# Patient Record
Sex: Female | Born: 1945 | Race: White | Hispanic: No | Smoking: Former smoker
Health system: Southern US, Community
[De-identification: ages and names within clinical notes are randomized; demographics above are authoritative.]

## PROBLEM LIST (undated history)

## (undated) DIAGNOSIS — I10 Essential (primary) hypertension: Secondary | ICD-10-CM

## (undated) DIAGNOSIS — E785 Hyperlipidemia, unspecified: Secondary | ICD-10-CM

## (undated) DIAGNOSIS — R569 Unspecified convulsions: Secondary | ICD-10-CM

## (undated) DIAGNOSIS — E119 Type 2 diabetes mellitus without complications: Secondary | ICD-10-CM

## (undated) DIAGNOSIS — I639 Cerebral infarction, unspecified: Secondary | ICD-10-CM

## (undated) DIAGNOSIS — T7840XA Allergy, unspecified, initial encounter: Secondary | ICD-10-CM

## (undated) DIAGNOSIS — F329 Major depressive disorder, single episode, unspecified: Secondary | ICD-10-CM

## (undated) DIAGNOSIS — G8929 Other chronic pain: Secondary | ICD-10-CM

## (undated) DIAGNOSIS — F32A Depression, unspecified: Secondary | ICD-10-CM

## (undated) HISTORY — DX: Allergy, unspecified, initial encounter: T78.40XA

## (undated) HISTORY — DX: Cerebral infarction, unspecified: I63.9

## (undated) HISTORY — DX: Depression, unspecified: F32.A

## (undated) HISTORY — DX: Type 2 diabetes mellitus without complications: E11.9

## (undated) HISTORY — DX: Unspecified convulsions: R56.9

## (undated) HISTORY — DX: Essential (primary) hypertension: I10

## (undated) HISTORY — DX: Other chronic pain: G89.29

## (undated) HISTORY — PX: FOOT SURGERY: SHX648

## (undated) HISTORY — PX: BACK SURGERY: SHX140

## (undated) HISTORY — PX: ABDOMINAL HYSTERECTOMY: SHX81

## (undated) HISTORY — DX: Major depressive disorder, single episode, unspecified: F32.9

## (undated) HISTORY — DX: Hyperlipidemia, unspecified: E78.5

---

## 2000-12-06 ENCOUNTER — Ambulatory Visit (HOSPITAL_COMMUNITY): Admission: RE | Admit: 2000-12-06 | Discharge: 2000-12-06 | Payer: Self-pay | Admitting: Neurosurgery

## 2000-12-06 ENCOUNTER — Encounter: Payer: Self-pay | Admitting: Neurosurgery

## 2001-02-12 ENCOUNTER — Ambulatory Visit (HOSPITAL_COMMUNITY)
Admission: RE | Admit: 2001-02-12 | Discharge: 2001-02-12 | Payer: Self-pay | Admitting: Physical Medicine & Rehabilitation

## 2001-02-12 ENCOUNTER — Encounter: Payer: Self-pay | Admitting: Physical Medicine & Rehabilitation

## 2001-02-28 ENCOUNTER — Encounter: Payer: Self-pay | Admitting: Physical Medicine & Rehabilitation

## 2001-02-28 ENCOUNTER — Ambulatory Visit (HOSPITAL_COMMUNITY)
Admission: RE | Admit: 2001-02-28 | Discharge: 2001-02-28 | Payer: Self-pay | Admitting: Physical Medicine & Rehabilitation

## 2001-06-27 ENCOUNTER — Encounter: Payer: Self-pay | Admitting: Physical Medicine & Rehabilitation

## 2001-06-27 ENCOUNTER — Encounter
Admission: RE | Admit: 2001-06-27 | Discharge: 2001-06-27 | Payer: Self-pay | Admitting: Physical Medicine & Rehabilitation

## 2001-07-12 ENCOUNTER — Encounter: Payer: Self-pay | Admitting: Physical Medicine & Rehabilitation

## 2001-07-12 ENCOUNTER — Encounter
Admission: RE | Admit: 2001-07-12 | Discharge: 2001-07-12 | Payer: Self-pay | Admitting: Physical Medicine & Rehabilitation

## 2001-07-30 ENCOUNTER — Encounter
Admission: RE | Admit: 2001-07-30 | Discharge: 2001-07-30 | Payer: Self-pay | Admitting: Physical Medicine & Rehabilitation

## 2001-07-30 ENCOUNTER — Encounter: Payer: Self-pay | Admitting: Physical Medicine & Rehabilitation

## 2001-10-02 ENCOUNTER — Encounter
Admission: RE | Admit: 2001-10-02 | Discharge: 2001-12-31 | Payer: Self-pay | Admitting: Physical Medicine & Rehabilitation

## 2002-02-25 DIAGNOSIS — I2510 Atherosclerotic heart disease of native coronary artery without angina pectoris: Secondary | ICD-10-CM | POA: Insufficient documentation

## 2002-07-31 ENCOUNTER — Encounter: Admission: RE | Admit: 2002-07-31 | Discharge: 2002-10-29 | Payer: Self-pay | Admitting: Neurosurgery

## 2003-01-27 ENCOUNTER — Encounter
Admission: RE | Admit: 2003-01-27 | Discharge: 2003-04-27 | Payer: Self-pay | Admitting: Physical Medicine & Rehabilitation

## 2003-04-29 ENCOUNTER — Encounter
Admission: RE | Admit: 2003-04-29 | Discharge: 2003-07-28 | Payer: Self-pay | Admitting: Physical Medicine & Rehabilitation

## 2003-09-26 ENCOUNTER — Encounter
Admission: RE | Admit: 2003-09-26 | Discharge: 2003-12-25 | Payer: Self-pay | Admitting: Physical Medicine & Rehabilitation

## 2004-01-27 ENCOUNTER — Encounter
Admission: RE | Admit: 2004-01-27 | Discharge: 2004-04-26 | Payer: Self-pay | Admitting: Physical Medicine & Rehabilitation

## 2004-01-29 ENCOUNTER — Ambulatory Visit: Payer: Self-pay | Admitting: Physical Medicine & Rehabilitation

## 2004-05-19 ENCOUNTER — Encounter
Admission: RE | Admit: 2004-05-19 | Discharge: 2004-08-17 | Payer: Self-pay | Admitting: Physical Medicine & Rehabilitation

## 2004-05-20 ENCOUNTER — Ambulatory Visit: Payer: Self-pay | Admitting: Physical Medicine & Rehabilitation

## 2004-08-16 ENCOUNTER — Ambulatory Visit: Payer: Self-pay | Admitting: Physical Medicine & Rehabilitation

## 2004-11-17 ENCOUNTER — Encounter
Admission: RE | Admit: 2004-11-17 | Discharge: 2005-02-15 | Payer: Self-pay | Admitting: Physical Medicine & Rehabilitation

## 2004-11-17 ENCOUNTER — Ambulatory Visit: Payer: Self-pay | Admitting: Physical Medicine & Rehabilitation

## 2005-02-15 ENCOUNTER — Encounter
Admission: RE | Admit: 2005-02-15 | Discharge: 2005-05-16 | Payer: Self-pay | Admitting: Physical Medicine & Rehabilitation

## 2005-02-15 ENCOUNTER — Ambulatory Visit: Payer: Self-pay | Admitting: Physical Medicine & Rehabilitation

## 2005-05-18 ENCOUNTER — Encounter
Admission: RE | Admit: 2005-05-18 | Discharge: 2005-08-16 | Payer: Self-pay | Admitting: Physical Medicine & Rehabilitation

## 2005-05-18 ENCOUNTER — Ambulatory Visit: Payer: Self-pay | Admitting: Physical Medicine & Rehabilitation

## 2005-06-28 ENCOUNTER — Ambulatory Visit: Payer: Self-pay | Admitting: Internal Medicine

## 2005-08-17 ENCOUNTER — Ambulatory Visit: Payer: Self-pay | Admitting: Physical Medicine & Rehabilitation

## 2005-08-17 ENCOUNTER — Encounter
Admission: RE | Admit: 2005-08-17 | Discharge: 2005-11-15 | Payer: Self-pay | Admitting: Physical Medicine & Rehabilitation

## 2005-11-08 ENCOUNTER — Ambulatory Visit: Payer: Self-pay | Admitting: Physical Medicine & Rehabilitation

## 2006-03-08 ENCOUNTER — Ambulatory Visit: Payer: Self-pay | Admitting: Physical Medicine & Rehabilitation

## 2006-03-08 ENCOUNTER — Encounter
Admission: RE | Admit: 2006-03-08 | Discharge: 2006-06-06 | Payer: Self-pay | Admitting: Physical Medicine & Rehabilitation

## 2006-07-03 ENCOUNTER — Encounter
Admission: RE | Admit: 2006-07-03 | Discharge: 2006-10-01 | Payer: Self-pay | Admitting: Physical Medicine & Rehabilitation

## 2006-07-03 ENCOUNTER — Ambulatory Visit: Payer: Self-pay | Admitting: Physical Medicine & Rehabilitation

## 2006-10-31 ENCOUNTER — Encounter
Admission: RE | Admit: 2006-10-31 | Discharge: 2006-12-26 | Payer: Self-pay | Admitting: Physical Medicine & Rehabilitation

## 2006-10-31 ENCOUNTER — Ambulatory Visit: Payer: Self-pay | Admitting: Physical Medicine & Rehabilitation

## 2007-02-21 ENCOUNTER — Ambulatory Visit: Payer: Self-pay | Admitting: Physical Medicine & Rehabilitation

## 2007-02-21 ENCOUNTER — Encounter
Admission: RE | Admit: 2007-02-21 | Discharge: 2007-02-26 | Payer: Self-pay | Admitting: Physical Medicine & Rehabilitation

## 2007-06-14 ENCOUNTER — Encounter
Admission: RE | Admit: 2007-06-14 | Discharge: 2007-06-18 | Payer: Self-pay | Admitting: Physical Medicine & Rehabilitation

## 2007-06-14 ENCOUNTER — Ambulatory Visit: Payer: Self-pay | Admitting: Physical Medicine & Rehabilitation

## 2007-10-10 ENCOUNTER — Encounter
Admission: RE | Admit: 2007-10-10 | Discharge: 2007-11-22 | Payer: Self-pay | Admitting: Physical Medicine & Rehabilitation

## 2007-11-22 ENCOUNTER — Ambulatory Visit: Payer: Self-pay | Admitting: Physical Medicine & Rehabilitation

## 2008-02-08 ENCOUNTER — Encounter
Admission: RE | Admit: 2008-02-08 | Discharge: 2008-02-11 | Payer: Self-pay | Admitting: Physical Medicine & Rehabilitation

## 2008-02-11 ENCOUNTER — Ambulatory Visit: Payer: Self-pay | Admitting: Physical Medicine & Rehabilitation

## 2008-06-04 ENCOUNTER — Encounter
Admission: RE | Admit: 2008-06-04 | Discharge: 2008-07-10 | Payer: Self-pay | Admitting: Physical Medicine & Rehabilitation

## 2008-06-05 ENCOUNTER — Ambulatory Visit: Payer: Self-pay | Admitting: Physical Medicine & Rehabilitation

## 2008-07-10 ENCOUNTER — Ambulatory Visit: Payer: Self-pay | Admitting: Physical Medicine & Rehabilitation

## 2008-09-30 ENCOUNTER — Encounter
Admission: RE | Admit: 2008-09-30 | Discharge: 2008-10-03 | Payer: Self-pay | Admitting: Physical Medicine & Rehabilitation

## 2008-10-03 ENCOUNTER — Ambulatory Visit: Payer: Self-pay | Admitting: Physical Medicine & Rehabilitation

## 2008-12-23 ENCOUNTER — Encounter
Admission: RE | Admit: 2008-12-23 | Discharge: 2008-12-26 | Payer: Self-pay | Admitting: Physical Medicine & Rehabilitation

## 2008-12-26 ENCOUNTER — Ambulatory Visit: Payer: Self-pay | Admitting: Physical Medicine & Rehabilitation

## 2009-01-09 ENCOUNTER — Inpatient Hospital Stay: Payer: Self-pay | Admitting: Vascular Surgery

## 2009-03-11 ENCOUNTER — Ambulatory Visit: Payer: Self-pay | Admitting: Cardiology

## 2009-03-11 ENCOUNTER — Ambulatory Visit: Payer: Self-pay | Admitting: Ophthalmology

## 2009-03-25 ENCOUNTER — Ambulatory Visit: Payer: Self-pay | Admitting: Ophthalmology

## 2009-05-12 ENCOUNTER — Ambulatory Visit: Payer: Self-pay | Admitting: Ophthalmology

## 2009-05-18 ENCOUNTER — Inpatient Hospital Stay: Payer: Self-pay | Admitting: Vascular Surgery

## 2009-06-15 ENCOUNTER — Ambulatory Visit: Payer: Self-pay | Admitting: Vascular Surgery

## 2009-06-25 ENCOUNTER — Ambulatory Visit: Payer: Self-pay | Admitting: Vascular Surgery

## 2009-06-29 ENCOUNTER — Inpatient Hospital Stay: Payer: Self-pay | Admitting: Vascular Surgery

## 2009-07-16 ENCOUNTER — Encounter: Payer: Self-pay | Admitting: Internal Medicine

## 2009-07-16 ENCOUNTER — Ambulatory Visit: Payer: Self-pay | Admitting: Internal Medicine

## 2009-07-22 ENCOUNTER — Ambulatory Visit: Payer: Self-pay | Admitting: Specialist

## 2009-07-26 ENCOUNTER — Encounter: Payer: Self-pay | Admitting: Internal Medicine

## 2009-08-26 ENCOUNTER — Encounter: Payer: Self-pay | Admitting: Internal Medicine

## 2009-09-25 ENCOUNTER — Encounter: Payer: Self-pay | Admitting: Internal Medicine

## 2009-10-26 ENCOUNTER — Encounter: Payer: Self-pay | Admitting: Internal Medicine

## 2009-11-11 ENCOUNTER — Ambulatory Visit: Payer: Self-pay | Admitting: Vascular Surgery

## 2009-11-26 ENCOUNTER — Encounter: Payer: Self-pay | Admitting: Internal Medicine

## 2009-12-07 ENCOUNTER — Inpatient Hospital Stay: Payer: Self-pay | Admitting: Vascular Surgery

## 2010-08-10 NOTE — Assessment & Plan Note (Signed)
Ms. Hirsch returns to the clinic today for followup evaluation.  Overall  she reports she is doing well.  She is using her hydrocodone anywhere  from 0 to 2 tablets per day and just ran out of 100 tablets yesterday,  dated from November 02, 2006.  That indicates that she is certainly not  over using her medication, she has used 100 in approximately 90 days.  She does need a refill in the office today.  She reports that her blood  sugar has been in the 200 range, still on regular insulin 100 units  twice a day.  She and her family physician are reportedly comfortable  with those readings.   REVIEW OF SYSTEMS:  Positive for diarrhea.   MEDICATIONS:  1. Aspirin 325 mg.  2. Regular insulin 100 units q.12 hours.  3. Protonix 40 mg.  4. Tricor 145 mg.  5. Lipitor 80 mg.  6. Lasix 40 mg b.i.d.  7. Hydrocodone 5/500 one tablet b.i.d. p.r.n.   PHYSICAL EXAMINATION:  GENERAL:  A well-appearing, thin adult female in  mild to no acute discomfort.  VITAL SIGNS:  Blood pressure 180/92 with pulse 78, respiratory rate 18,  and O2 saturation 98% on room air.  NEUROLOGICAL:  She has 5-/5 strength throughout the bilateral upper and  lower extremities.  She ambulates without any assistive device.   IMPRESSION:  1. 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  2. Left ankle stress fracture.  3. Intermixed right elbow extensor pain.   In the office today we did refill the patient's hydrocodone, a total of  100 as of February 26, 2007.  We will plan on seeing her in followup in  approximately 4 months time with refills prior to that appointment as  necessary.           ______________________________  Ellwood Dense, M.D.     DC/MedQ  D:  02/26/2007 10:16:55  T:  02/26/2007 10:55:28  Job #:  [id number]

## 2010-08-10 NOTE — Assessment & Plan Note (Signed)
Ms. Reder returns to clinic today for followup evaluation.  She reports  that overall she is doing well.  She has increased her hydrocodone  slightly up to 4 times a day as needed.  She still uses minimal amounts  of that, but does need a refill in the office today.  Her last script  was filled in May 2009.  She reports that her blood sugars still were in  the 200 range.   She reports that she continues to get good benefit from her hydrocodone,  used 4 times a day as needed.   MEDICATIONS:  1. Aspirin 325 mg daily.  2. Regular insulin 100 units q.12 h.  3. Protonix 40 mg daily.  4. TriCor 145 mg daily.  5. Lipitor 80 mg daily.  6. Lasix 40 mg b.i.d.  7. Hydrocodone 5/500 one tablet q.i.d. p.r.n.   PHYSICAL EXAMINATION:  GENERAL:  A well-appearing, fit adult female in  mild acute discomfort.  VITAL SIGNS:  Blood pressure 178/72 with a pulse 78, respiratory rate  18, and O2 saturation 98% on room air.  MUSCULOSKELETAL:  She ambulates without any assistive device.  She has 5-  /5 strength throughout.   IMPRESSION:  1. 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  2. Right ankle stress fracture.  3. Right elbow extensor pain, resolved.   In the office today, we did refill the patient's hydrocodone 5/500 to be  used q.i.d. p.r.n.  We will plan on seeing the patient in followup in  this office in approximately 4 months' time with refills prior to that  appointment as necessary.  The patient continues to get good analgesic  effect overall without signs of diversion or significant side effects.           ______________________________  Ellwood Dense, M.D.     DC/MedQ  D:  11/23/2007 11:43:24  T:  11/24/2007 04:54:09  Job #:  [id number]

## 2010-08-10 NOTE — Assessment & Plan Note (Signed)
Kathleen Mcguire returns today.  She was last seen on February 11, 2008.  She  has a history chronic low back pain secondary to mild lumbar  spondylosis.  She has had a right ankle stress fracture.  She has been  taking hydrocodone 5/500, she gets prescribed 120, but this really last  her about 37-month.  She states she basically takes one a day.  She has  had no new medical problems in interval time.   MEDICATIONS:  Aspirin, regular insulin, Protonix, TriCor, Lipitor, Lasix  as well as the hydrocodone as mentioned.   In the general, an elderly female in no acute distress.  Orientation x3.  Mood and affect are appropriate.  Gait is normal.  She is able to toe  walk, heel walk, but only very shortly, uses a cane because she states  her balance is off.   Back has only mild tenderness to palpation, lumbar paraspinals, lumbar  range of motion by 50% range forward flexion and extension.  Her lower  extremity strength is normal.  Range of motion is mildly reduced in the  hips, otherwise intact at the knees and ankles.  Sensation is intact.  Deep tendon reflexes are normal.   IMPRESSION:  Chronic low back pain.  Lumbar spondylosis.  Given that she  is on such minimal doses of hydrocodone.  We will see if she can be just  as well on tramadol 50 mg b.i.d.  I will check a urine drug screen in  the event that we go back to utilizing narcotic analgesics, I do not  expect any hydrocodone given her usage pattern, mainly checking to see  if there is any non-prescribed opiates or illicit drug.      Erick Colace, M.D.  Electronically Signed     AEK/MedQ  D:  06/05/2008 16:19:26  T:  06/06/2008 03:04:54  Job #:  [id number]

## 2010-08-10 NOTE — Assessment & Plan Note (Signed)
Ms. Penland returns to clinic today for followup evaluation.  She reports  that she is doing fairly well using hydrocodone anywhere from 2-4  tablets per day.  She does need a refill today with her last script  being from late August 2009.   The patient reports that her blood sugars have been in the 200 range and  that her family physician is comfortable with those readings at present.   REVIEW OF SYSTEMS:  Positive for high blood sugar, diarrhea, and night  sweats.   MEDICATIONS:  1. Aspirin 325 mg daily.  2. Regular insulin 100 units q.12 hours.  3. Protonix 40 mg daily.  4. TriCor 145 mg daily.  5. Lipitor 80 mg daily.  6. Lasix 40 mg b.i.d.  7. Hydrocodone 5/500 one tablet q.i.d. p.r.n. (1-4 per day).   PHYSICAL EXAMINATION:  GENERAL:  A well-appearing adult female in mild-  to-no acute discomfort.  VITALS:  Blood pressure 161/89 with a pulse of 82, respiration is 18,  and O2 saturation 98% on room air.  She ambulates without any assistive  device.  She has 5-/5 strength throughout.   IMPRESSION:  1. Chronic low back pain secondary to mild lumbar spondylosis.  2. Right ankle stress fracture.  3. Right elbow extensive pain, resolved.   In the office today, we did refill the patient's hydrocodone 5/500  q.i.d. p.r.n.  We will plan on seeing the patient in followup in  approximately 3-4 months time with refills, prior to that appointment as  necessary.  She continues to get good analgesic effect without signs of  diversion with significant side effects.           ______________________________  Ellwood Dense, M.D.     DC/MedQ  D:  02/11/2008 10:07:01  T:  02/12/2008 04:22:08  Job #:  [id number]

## 2010-08-10 NOTE — Assessment & Plan Note (Signed)
Kathleen Mcguire returns to clinic today for followup evaluation.  She  reports that she is doing fairly well overall with her hydrocodone.  She  does notice an increased pain in her right forearm especially with  sensation being decreased in her 4th and 5th digits of her right hand.  She wonders if she can use anything for the pain in the right forearm  especially at night when she has difficulty sleeping.  She reports that  her blood sugars continue to be approximately 200 with insulin now at  100 units twice daily.   REVIEW OF SYSTEMS:  Positive for high blood sugar, constipation,  coughing, limb swelling and shortness of breath.   MEDICATIONS:  1. Aspirin 325 mg daily.  2. Regular insulin 100 units q.12 h.  3. Protonix 40 mg daily.  4. Tricor 145 mg daily.  5. Lipitor 80 mg daily.  6. Lasix 40 mg b.i.d.  7. Hydrocodone 5/500 one tablet b.i.d. p.r.n.   PHYSICAL EXAMINATION:  GENERAL:  Reasonably well-appearing middle-aged  adult female in mild acute discomfort.  VITAL SIGNS:  Blood pressure 150/86, pulse 73, respiratory rate 18 and  O2 saturation 97% on room air.  MUSCULOSKELETAL:  She has 5/5 strength of the bilateral upper and lower  extremities.  She is noted to have pain in her wrist extensors  proximally around her right elbow.   IMPRESSION:  1. 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  2. Healed left ankle stress fracture.  3. Right elbow extensor pain.   In the office today we did refill the patient's hydrocodone.  We also  gave her samples of Lidoderm patch 5% to be applied 1 patch to the right  forearm nightly and removed q.a.m., on 12 hours and off 12 hours daily.  We will plan on seeing her in followup in approximately 4 months' time.  If she is getting benefit from the Lidoderm patch she can call in for a  refill by prescription.           ______________________________  Ellwood Dense, M.D.     DC/MedQ  D:  11/02/2006 10:58:05  T:  11/02/2006  13:53:16  Job #:  [id number]

## 2010-08-10 NOTE — Assessment & Plan Note (Signed)
The patient last seen July 10, 2008.  No new medical problems in the  interval time.  We tried taking her down on her hydrocodone to 1 a day,  but really has not tolerated this.  She has had no new medical problems  in the interval time.  She has taken tramadol in the past, which is not  quite as helpful as the hydrocodone for her.   She just filled her prescription June 17, but ran out of medications  today.   Her pain is 6-7/10, sharp, burning, stabbing, constant, and aching in  her back, in the left hip area.  She can walk 3-5 minutes at a time.  She climbs steps.  She has complaints of numbness and tingling in her  feet.  She is diabetic.  She has some diarrhea, but no constipation.   Blood pressure 188/65, she states it is always high when she comes to  this clinic; pulse 80, respirations 18, O2 sat 97% on room air.   SOCIAL HISTORY:  Lives by herself, is not working.   Gait, she has no evidence of toe drag or knee instability.  She has a  stiff-legged gait.  She has limited range of motion in the lumbar spine  approximately 50%, forward flexion and extension.  She has decreased  sensation in feet, abnormal deep tendon reflexes 1+ at the knees and 0  at the ankles.  She has tenderness over the left trochanteric bursa.   IMPRESSION:  1. Low back pain with lumbar spondylosis  2. Trochanteric bursitis.   PLAN:  1. Continue hydrocodone, but increase to b.i.d. for her back.  2. Trochanteric bursitis, Aleve 2 p.o. b.i.d. over the next week.  3. Instructed her the stretching exercises for her tensor fascia lata.  4. We discussed the possibility of corticosteroid injection, which she      declines at this time.   We will see her back in 3 months, Vicodin 5/500 prescribed 44-month  supply.      Erick Colace, M.D.  Electronically Signed     AEK/MedQ  D:  10/03/2008 12:46:36  T:  10/04/2008 02:07:21  Job #:  [id number]

## 2010-08-10 NOTE — Assessment & Plan Note (Signed)
Kathleen Mcguire returns today, last seen on June 05, 2008.  Urine drug  screen was consistent.  History of chronic low back pain with mild  lumbar spondylosis.  She takes one hydrocodone 5/500 a day.  We trialed  her on tramadol for a month 50 b.i.d. and she states that really did not  work.   Other medications include aspirin, Reglan, insulin, Protonix, TriCor,  Lipitor, Lasix as well as the hydrocodone in the past, but currently on  tramadol.   Pain score is about 8/10.  Sleep is poor.  She walk 3-5 minutes at a  time.  She needs help with household duties and shopping.  Her review of  systems positive numbness, tingling, trouble walking, dizziness, limb  swelling, and diarrhea.  She sees a family practice doctor for general  medical care.   Her Oswestry disability score 64% today.   SOCIAL HISTORY:  Divorced, lives alone.   No no smoking.  No drinking.   Her blood pressure elevated at 192/82, pulse 82, respirations 80, and O2  sat 96% room air.  GENERAL:  No acute stress.  Orientation x3.  Right lower gait is normal.  Her back has mild tenderness to palpation lumbar paraspinal.  She has  pain with forward flexion as well as with extension.  Forward flexion is  more painful.  Her range of motion about 50% range forward flexion and  extension, and lower extremity strength is normal.  Deep tendon reflexes  are normal.  Range of motion is normal in lower extremities.   IMPRESSION:  Chronic low back pain and lumbar spondylosis.  We will put  her back on hydrocodone one p.o. daily 16-month supply, #30.  See her  back in 3 months to monitor compliance.  No signs of aberrant drug  behavior.  Follow up with her primary care on her hypertension and  another medical problems.      Erick Colace, M.D.  Electronically Signed     AEK/MedQ  D:  07/10/2008 15:28:10  T:  07/11/2008 06:38:46  Job #:  [id number]

## 2010-08-13 NOTE — Assessment & Plan Note (Signed)
Kathleen Mcguire returns to clinic today for follow up evaluation.  Overall,  she is doing well.  She unfortunately has had a problem getting her  hydrocodone 5/500.  Apparently, they had 5/325, but that was the only  formulation that they had available.  She tried using that even with  Tylenol and Aleve, and still did not get the same benefits as using the  5/500.  She feels that they may have a new supply in at the present  time.  There are no reports of problems with the hydrocodone, but  certainly the oxycodone supply has been limited recently.   REVIEW OF SYSTEMS:  Positive for night sweats, high blood sugars,  diarrhea, constipation, urinary retention, limb swelling and coughing.   MEDICATIONS:  1. Aspirin 325 mg daily.  2. Regular insulin 100 units q.12h.  3. Protonix 40 mg daily.  4. Tricor 145 mg daily.  5. Lipitor 80 mg daily.  6. Lasix 40 mg b.i.d.  7. Hydrocodone 5/500 one tablet b.i.d. p.r.n.   PHYSICAL EXAMINATION:  GENERAL:  Well-appearing adult female in mild  acute discomfort.  VITAL SIGNS:  Blood pressure 150/65 with a pulse of 88, respiratory rate  18 and oximetry saturation 95% on room air.  NEUROLOGIC:  She has 5-/5 strength throughout.  She ambulates without  any assistive device.   IMPRESSION:  1. 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  2. Left ankle stress fracture.  3. Right elbow extensor pain, resolved.   In the office today, we did refill the patient's hydrocodone 5/500 one  tablet b.i.d. p.r.n.  She will call in for refills as necessary, and  will plan to see her in follow up in approximately 4 months' time.           ______________________________  Ellwood Dense, M.D.     DC/MedQ  D:  06/18/2007 10:41:34  T:  06/18/2007 11:06:49  Job #:  [id number]

## 2010-08-13 NOTE — Assessment & Plan Note (Signed)
MEDICAL RECORD #[id number]   REASON FOR VISIT:  Ms. Dewalt returns to clinic today for follow-up  evaluation.  She reports that overall she is doing well.  She continues to  take minimum Vicodin, approximately 2-3 times per week.  She does need a  refill on that medication in the office today.  She reports that overall she  has been doing well with blood sugars being in the 100-110 range.   MEDICATIONS:  1. Insulin 60 units regular b.i.d.  2. Hydrochlorothiazide daily.  3. Vicodin 5/500 one tablet q.h.s. p.r.n.  4. Aspirin 225 mg daily.  5. Vasotec one tablet daily.   PHYSICAL EXAMINATION:  Well-appearing, slightly overweight adult female.  Blood pressure 156/77 with a pulse of 71, respiratory rate 14, and O2  saturation 97% on room air.  The patient has 5-/5 strength throughout the  bilateral upper and lower extremities.  Bulk and tone were normal and  reflexes were 2+ and symmetrical.   IMPRESSION:  1. Chronic low back pain with mild lumbar spondylosis per MRI study in 2002.  2. Left ankle stress fracture, healed.   In the clinic today I did refill her Vicodin one tablet p.o. q.h.s. p.r.n. a  total of #60.  Will plan on seeing the patient in follow-up in approximately  3-4 months' time.      Ellwood Dense, M.D.   DC/MedQ  D:  10/01/2003 14:39:36  T:  10/01/2003 15:19:57  Job #:  [id number]

## 2010-08-13 NOTE — Assessment & Plan Note (Signed)
Kathleen Mcguire returns to clinic today for follow-up evaluation.  She reports  that overall, she is getting good relief from her Vicodin.  She had  generally used 0 to 2 per day but rarely uses a third tablet.  She is caring  for her mother and reports that that is causing a fair amount of stress in  her home.  Her blood sugars have been elevated to approximately 200 and  higher.  She has had a recent adjustment of her insulin to compensate.   MEDICATIONS:  1. Aspirin 325 mg daily.  2. 40 units Regular and 60 units NPH twice daily each.  3. Protonix 40 mg daily.  4. Tricor 140 mg daily.  5. Lipitor 80 mg daily.  6. Lasix 40 mg twice daily.  7. Vicodin 5/500 1 tablet twice daily as needed.   REVIEW OF SYSTEMS:  Positive for weight gain, high blood sugars, diarrhea,  constipation, nausea, limb swelling, wheezing.   PHYSICAL EXAMINATION:  GENERAL:  A reasonably well appearing, mildly to  moderately overweight adult female in mild to no acute discomfort.  VITAL SIGNS:  Blood pressure 156/57 with a pulse 72, respiratory rate 16,  oxygen saturations 975 on room air.   She has 5-/5 strength for bilateral upper and lower extremities.  Bulk and  tone were normal.  Reflexes were 2+ and symmetrical and sensation was intact  to light touch throughout.  She ambulates without any assistive device.   IMPRESSION:  1. 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  2. Healed left ankle stress fracture.   In the office today, we did refill the patient's Vicodin 5/500 1 tablet  twice daily as needed, a total of 60.  We will plan to see her in follow up  in approximately four months time.  She is getting good analgesic effect and  staying active, caring for her dogs and her mother.  She is experiencing no  adverse side effects.           ______________________________  Ellwood Dense, M.D.     DC/MedQ  D:  11/10/2005 10:36:14  T:  11/10/2005 10:57:22  Job #:  [id number]

## 2010-08-13 NOTE — Assessment & Plan Note (Signed)
MEDICAL RECORD NUMBER:  [id number]   Kathleen Mcguire returns to the clinic today for follow-up evaluation. She reports  that overall she is doing about the same. She does get reasonable relief  with hydrocodone but generally uses only four to five tablets per week. She  also uses Extra-Strength Tylenol as on as-needed basis when she does not use  the hydrocodone. She does complain of periodic severe pain in the hypothenar  prominence of her right  hand. She reports that this occurs a few times a  month and is very severe for a few minutes but then resolves on its own. She  reports that her blood sugar has been in the 150 range and she feels that  Dr. Almond Lint is happy with those readings at the present time.   MEDICATIONS:  1.  Insulin regular 60 units b.i.d.  2.  Hydrochlorothiazide one tablet daily.  3.  Vicodin 5/500 one tablet b.i.d. p.r.n.  4.  Aspirin 325 mg daily.  5.  Vasotec one tablet daily.   REVIEW OF SYSTEMS:  Positive for high blood sugar, diarrhea, constipation.   PHYSICAL EXAMINATION:  Reasonably well-appearing, mildly-overweight adult  female in mild acute discomfort. Blood pressure 159/52 with a pulse of 88,  respiratory rate of 16, and O2 saturation 94% on room air. She ambulates  without any assistive device. She has 5-/5 strength throughout the bilateral  upper and lower extremities. Lumbar range of motion was decreased in flexion  and extension.   IMPRESSION:  1.  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  2.  Left ankle stress fracture, healed.   In the office today we did refill the patient's hydrocodone and allowed her  to use up to three tablets per day on an as-needed basis. She generally uses  no more than one per day. We will plan on seeing the patient in follow-up in  approximately 3 months' time. She needs to continue monitoring her blood  sugars for better control as the pain that she is experiencing in her right  hand may be secondary to her  diabetes. It does not appear that there is any  significant trauma resulting in the onset of that pain. She has decided to  hold on the start of any new medication such as Lyrica at this point.           ______________________________  Ellwood Dense, M.D.     DC/MedQ  D:  02/21/2005 10:00:29  T:  02/21/2005 10:30:40  Job #:  [id number]

## 2010-08-13 NOTE — Assessment & Plan Note (Signed)
Friday, March 10, 2006:   Kathleen Mcguire returns to clinic today for followup evaluation.  She reports  that she is having a fair amount of stress in her life related to her  mother has been diagnosed with dementia along with Parkinson's disease.  She has now been placed in an Alzheimer's unit, [facility].  The  patient was having significant problems with her, trying to manage her  at her house.  Stress has caused an elevation in blood sugars with  recent readings in the 200s.  She continues to take the Vicodin  approximately 0-2 tablets per day.  She is running out of them slightly  earlier than she had been previously.  Still, her 60 tablets last her  almost 4 months.  We do need to increase that amount slightly to allow  her adequate control over the 4 months before we see her in followup.   MEDICATIONS:  1. Aspirin 325 mg  2. Regular insulin 40 units and 60 units subcutaneously b.i.d.  3. Protonix 40 mg daily.  4. Tricor 140 mg daily.  5. Lipitor 80 mg daily.  6. Lasix 40 mg b.i.d.  7. Vicodin 5/500 1 tablet b.i.d. p.r.n. (0-2 per day).   REVIEW OF SYSTEMS:  Positive for high blood sugars, weight gain, limb  swelling, nausea.   PHYSICAL EXAMINATION:  GENERAL:  Reasonably well-appearing middle-aged  adult female in mild acute discomfort.  VITAL SIGNS:  Blood pressure 187/81 with pulse of 81, respiratory rate  16 and O2 saturation 97% on room air.  She ambulates without any assistive device.  She has 5-/5 strength  throughout the bilateral upper and lower extremities.   IMPRESSION:  1. 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  2. Healed left ankle stress fracture.   In the office today, we did refill the hydrocodone 5/500 and increased  it to 1 tablet t.i.d. p.r.n., a total of 100 were prescribed.  This  should cover her for the 4 months until we see her in followup.           ______________________________  Ellwood Dense, M.D.     DC/MedQ  D:   03/10/2006 10:46:04  T:  03/10/2006 11:26:06  Job #:  [id number]

## 2010-08-13 NOTE — Assessment & Plan Note (Signed)
Kathleen Mcguire returns to clinic today for follow-up evaluation.  Overall she is  doing well.  She did have a recent evaluation for pancreatitis in March and  April of this year.  They apparently found some stomach ulcers along with a  hiatal hernia but no actual cause for her pancreatitis.  That has resolved.  She was taking a little bit more of the Vicodin at that time but still has  not had a refill since approximately May 23, 2005.  She does not use  anywhere from 0-2 tablets per day and needs a refill in the office today.   The patient reports that her blood sugars have generally been higher than  200 recently and she reports that her primary care physician is aware of  that finding.   MEDICATIONS:  1.  Aspirin 325 mg daily.  2.  Regular insulin 60 units b.i.d.  3.  Protonix 40 mg daily.  4.  Tricor 140 mg daily.  5.  Lipitor 80 mg daily.  6.  Lasix 40 mg b.i.d.   REVIEW OF SYSTEMS:  Positive for weight gain, high blood sugars, diarrhea,  constipation, abdominal pain, limb swelling, shortness of breath, and sleep  apnea.   PHYSICAL EXAMINATION:  GENERAL:  A reasonably well-appearing, mildly  overweight adult female in mild to no acute discomfort.  VITAL SIGNS:  Blood pressure 184/73 with pulse 75, respiratory rate 17 and  O2 saturation 99% on room air.  MUSCULOSKELETAL/NEUROLOGIC:  She has 5-/5 strength throughout the bilateral  upper and lower extremities.   IMPRESSION:  1.  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  2.  Left ankle stress fracture, healed.   In the office today we did refill the patient's Vicodin 5/500 mg one tablet  b.i.d. p.r.n.  She continues to take minimal amounts such that she is just  recently out of her prescription given to her for 60 tablets from February.  We will plan on seeing the patient in follow-up in this office in  approximately three months' time with refills prior to that appointment as  necessary.     ______________________________  Ellwood Dense, M.D.     DC/MedQ  D:  08/18/2005 10:07:49  T:  08/18/2005 14:20:17  Job #:  [id number]

## 2010-08-13 NOTE — Assessment & Plan Note (Signed)
Kathleen Mcguire returns to clinic today for follow-up evaluation.  She reports  that overall she is doing well.  She continues to go to the [facility]  occasionally.  She is using her Vicodin 5/500 one tablet q.h.s. p.r.n.  She  does not use it every night but generally does use it.  She reports that her  blood sugars have been in the 120 range.  She did experience flu symptoms  for a few weeks last month with temperatures ranging from 96 to 103.  She  also had nausea and vomiting along with diarrhea with that episode.  She  reports that she is improved but she still has some minor symptoms.   MEDICATIONS:  1. Insulin 60 units of regular b.i.d.  2. Hydrochlorothiazide daily.  3. Vicodin 5/500 one tablet q.h.s. p.r.n.  4. Aspirin 325 mg daily.  5. Vasotec one tablet daily.   PHYSICAL EXAMINATION:  GENERAL APPEARANCE:  A well-appearing, slightly to  moderately overweight adult female.  VITAL SIGNS:  Blood pressure 196/82 with pulse of 78 and respiratory rate 14  with O2 saturation 99% on room air.  NEUROLOGIC:  The patient has 5-/5 strength throughout the bilateral upper  and lower extremities.  She does report that her blood pressure is elevated  today as she was running late to this appointment. She reports that she  plans to follow up with primary care physician.   IMPRESSION:  1. Chronic low back pain with mild lumbar spondylosis per MRI study 2002.  2. Left ankle stress fracture, healed.   In the clinic today, I did refill her Vicodin 5/500 one tablet p.o. q.h.s.  p.r.n., a total of 60 with no refills.  Will plan on refilling that for her  prior to the next appointment which is scheduled for approximately three  months' time.      Ellwood Dense, M.D.   DC/MedQ  D:  06/30/2003 10:09:16  T:  06/30/2003 12:22:54  Job #:  [id number]

## 2010-08-13 NOTE — Assessment & Plan Note (Signed)
REASON FOR EVALUATION:  Ms. Koy returns to clinic today for followup  evaluation.  She does report increased arthritic pain, especially of her  bilateral hands.  She has tried Naprosyn along with Motrin and Tylenol in  the past without significant benefit.  She also had tried Vioxx and Celebrex  in the past for her low back pain but her arthritic pain was not really a  significant problem at that time.  She would like to try some generic anti-  inflammatory medication at this time.  The patient has quit attending the  [facility] where she was swimming and that is secondary to the cold temperature of  the water.  She has instead joined [employer] and is swimming periodically in  the 98-degree pool.  She reports her blood sugar has been in the 130 range.   MEDICATIONS:  1. Insulin -- 60 units of Regular b.i.d.  2. Hydrochlorothiazide daily.  3. Vicodin 5/500 mg 1 tablet daily p.r.n.  4. Aspirin 325 mg daily.  5. Vasotec 1 tablet daily.   REVIEW OF SYSTEMS:  Unremarkable.   PHYSICAL EXAM:  VITAL SIGNS:  Vitals showed a blood pressure of 171/77 with  a pulse of 85 and O2 saturation of 98% on room air.  NEUROMUSCULAR:  She has strength of 5-/5 to 5/5 throughout the bilateral  upper and lower extremities.  Bulk and tone were normal and reflexes were 2+  and symmetrical.  Sensation was intact to light touch throughout.  There was  mild-to-moderate swelling of her interphalangeal joints, especially distally  on the bilateral hands.   IMPRESSION:  1. Chronic low back pain with mild lumbar spondylosis per MRI study, 2002.  2. Left ankle stress fracture, healed.   PLAN:  At the present time I have refilled her Vicodin 5/500 mg 1 tablet  p.o. daily p.r.n., a total of 60.  I have also given  her a prescription for  nabumetone 500 mg 1 tablet p.o. b.i.d. to be taken with food.  This should  be tried for approximately 4 to 8 weeks.  We will plan on seeing her in  followup in approximately 2 months'  time to see how she has done on this  anti-inflammatory medication.      Ellwood Dense, M.D.   DC/MedQ  D:  04/30/2003 13:32:21  T:  04/30/2003 14:32:29  Job #:  [id number]

## 2010-08-13 NOTE — Assessment & Plan Note (Signed)
MEDICAL RECORD NUMBER:  [id number].   Kathleen Mcguire returns to clinic today for followup evaluation. She reports that  overall she is doing well. She continues to use her Vicodin anywhere from 0  to 2 tablets per day but generally does not have to use it. She is still has  a few remaining from prescription given back in November of 2006. She  reports that her primary care physician, Dr. Micah Noel, is happy with her recent  blood sugars which have been in the 150 to 175 range. No other change in  medicine is made.   MEDICATIONS:  1.  Regular insulin 60 units b.i.d.  2.  Hydrochlorothiazide 1 tablet daily.  3.  Vicodin 5/500 1 tablet b.i.d. p.r.n.  4.  Aspirin 325 mg daily.  5.  Vasotec 1 tablet daily.   REVIEW OF SYSTEMS:  Noncontributory.   PHYSICAL EXAMINATION:  Well appearing, mildly overweight adult female in no  acute discomfort. Blood pressure 140/33 with a pulse of 103, respiratory  rate 16 and O2 saturation 94% on room air. She has slight sniffles in the  office today. She has 5-/5 strength throughout the bilateral upper and lower  extremities. Lumbar range of motion is decreased, especially in the flexion  and extension.   IMPRESSION:  1.  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  2.  Left ankle stress fracture, healed.   In the office today, we did refill the patient's Vicodin 5/500 one tablet  p.o. b.i.d. p.r.n. as of May 23, 2005. We will plan on seeing her in  followup in this office in approximately three months' time.           ______________________________  Ellwood Dense, M.D.     DC/MedQ  D:  05/19/2005 10:05:26  T:  05/19/2005 15:52:33  Job #:  [id number]

## 2010-08-13 NOTE — Assessment & Plan Note (Signed)
DATE OF EVALUATION:  Aug 16, 2004.   MEDICAL RECORD NUMBER:  [id number].   [name] returns to the clinic today for followup evaluation.  She reports  that overall she is doing about the same.  She has actually kept down her  Vicodin 5/500 mg one tablet daily.  She actually has 10 left from her  prescription of 120 given to her on May 20, 2004.  She will need a  refill in early June of 2006.  She reports that she tries to be as active as  possible, going to the senior center on a daily basis.  She also cares for  several animals at her home.  She reports that her recent blood sugars have  been elevated to the 200 range and she is not sure exactly the reason.   MEDICATIONS:  1.  Insulin regular 60 units b.i.d.  2.  Hydrochlorothiazide one tablet daily.  3.  Vicodin 5/500 mg one tablet daily p.r.n.  4.  Aspirin 325 mg daily.  5.  Vasotec one tablet daily.   PHYSICAL EXAMINATION:  GENERAL APPEARANCE:  A reasonably well-appearing  adult female in no acute discomfort.  VITAL SIGNS:  Blood pressure 137/43 with a pulse of 79, a respiratory rate  of 16 and an O2 saturation of 99% on room air.  EXTREMITIES:  She has 5-/5 strength throughout the bilateral upper and lower  extremities.  Bulk and tone were normal.  She ambulates without any  assistive device.  Sensation was intact to light touch throughout.   IMPRESSION:  1.  Chronic low back pain with mild lumbar spondylosis per study in 2002.  2.  Left ankle stress fracture, healed.   In the office today, she did refill her hydrocodone 5/500 mg one tablet p.o.  b.i.d., a total of 60 as of August 26, 2004.  No refills on other medications  are necessary.  We plan on seeing the patient in followup in the office in  approximately three months' time with refills of medication prior to that  appointment as necessary.      DC/MedQ  D:  08/16/2004 09:28:40  T:  08/16/2004 09:50:29  Job #:  [id number]

## 2010-08-13 NOTE — Assessment & Plan Note (Signed)
HISTORY OF PRESENT ILLNESS:  [name] returns to the clinic today for  follow up evaluation.  She reports that she is doing well overall using her  hydrocodone 5/500 one tablet b.i.d. p.r.n.  She generally uses 0-2 tablets  per day.  She has a few remaining from her prescription filled back in early  June 2006.  She does need a refill in the office today.   MEDICATIONS:  1.  Insulin regular 60 units b.i.d.  2.  Hydrochlorothiazide one tablet daily.  3.  Vicodin 5/500 one tablet b.i.d. p.r.n.  4.  Aspirin 325 mg daily.  5.  Vasotec one tablet daily.   REVIEW OF SYSTEMS:  Noncontributory.   PHYSICAL EXAMINATION:  GENERAL APPEARANCE:  Well-appearing, adult female in  mild to no acute discomfort.  VITAL SIGNS:  Blood pressure 139/50, pulse 77, respirations 16, O2  saturation 96% on room air.  She has 5-/5 strength throughout the bilateral  upper and lower extremities.  She ambulates without any assisted device.  Sensation was intact to light touch throughout.   IMPRESSION:  1.  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  2.  Left ankle stress fracture, healed.   PLAN:  In the office today, we did refill the patient's hydrocodone one  tablet b.i.d. p.r.n.  We will plan on seeing the patient in follow up in  approximately three months time with refills of medication prior to that  appointment if necessary.           ______________________________  Ellwood Dense, M.D.     DC/MedQ  D:  11/18/2004 10:59:08  T:  11/18/2004 11:51:00  Job #:  [id number]

## 2010-08-13 NOTE — Assessment & Plan Note (Signed)
MEDICAL RECORD NUMBER:  [id number].   DATE OF BIRTH:  [birth year].   Kathleen Mcguire returns to the clinic today for followup evaluation.  She reports  that overall she is doing well.  She reports that she continues to get good  relief from the Vicodin which she uses only on a very sporadic basis.  She  has not had a refill on her Vicodin from October 01, 2003, when we gave her 85.  She does not use even more than one per week.  The patient reports that her  blood sugars have been in the 150 range recently.  She continues to do  chores around her house, caring for animals and also baby sitting for  grandchildren.   MEDICATIONS:  1.  Insulin 60 units regular b.i.d.  2.  Hydrochlorothiazide one tablet daily.  3.  Vicodin 5/500 mg one tablet q.h.s. p.r.n.  4.  Aspirin 325 mg daily.  5.  Vasotec one tablet daily.   PHYSICAL EXAMINATION:  A well-appearing, slightly overweight, adult female.  Blood pressure 150/75 with a pulse of 82, respiratory rate 20 and O2  saturations 96% on room air.  She has 5-/5 strength throughout the bilateral  upper and lower extremities.  Bulk and tone were normal.  Reflexes were 2+  and symmetrical.  She ambulates without any assistive device.   IMPRESSION:  1.  Chronic low back pain along with mild lumbar spondylosis per MRI study      in 2002.  2.  Left ankle stress fracture, healed.   In the clinic today, I did refill her Vicodin 5/500 one tablet p.o. daily  p.r.n. as of February 02, 2004.  She has sufficient supply until that date.  We will plan on seeing her in followup in approximately four months' time.  She is very compliant with all medication at the present time.       DC/MedQ  D:  01/29/2004 09:41:25  T:  01/29/2004 11:00:32  Job #:  [id number]

## 2010-08-13 NOTE — Assessment & Plan Note (Signed)
Kathleen Mcguire returns to the clinic today for followup evaluation. She reports  that overall she has not been doing well lately. She has been having to use  two of the Vicodin a day and still reports that she is having falls. She has  resorted to using a single point cane when she ambulates but otherwise uses  the Publishing rights manager for ambulation. She reports no recent falls while using  the single point cane.   The patient is also angry about paperwork completed through this office back  in August of 2004 concerning her work status. At that time, I felt that she  could sit for an extended period of time i.e. 5-6 hours per day.  She  reports that with that information her payments that she received were cut.  We have not completed any paperwork since August of 2004. We need to update  that information for her.   MEDICATIONS:  1.  Insulin 60 units regular b.i.d.  2.  Hydrochlorothiazide 1 tablet q.d.  3.  Vicodin 5/500, 1 tablet b.i.d. p.r.n.  4.  Aspirin 325 mg q.d.  5.  Vasotec 1 tablet q.d.   PHYSICAL EXAMINATION:  A reasonably well appearing adult female in mild  acute discomfort.  Blood pressure 157/67 with a pulse of 92, respiratory  rate 16 and O2 saturation 98% on room air. She has basically 4 to 4+/5  strength throughout the bilateral upper and lower extremities. Reflexes were  1 to 2+ and symmetrical throughout the bilateral upper and lower  extremities.  Sensation was slightly decreased to light touch throughout the  bilateral upper and lower extremities.   IMPRESSION:  1.  Chronic low back pain with mild lumbar spondylosis, primary study 2002.  2.  Left ankle stress fracture, healed.  3.  In the office today, we did refill her hydrocodone at 5/500 one tablet      p.o. b.i.d. p.r.n. for a total of 120 to cover her for at least a      month's time. Will complete paperwork supplied to [place] from her insurance      as we apparently did back in August of 2004.  This will have some   changes compared to the prior statement dated August 2004.      DC/MedQ  D:  05/20/2004 12:06:36  T:  05/20/2004 12:38:36  Job #:  [id number]

## 2010-08-13 NOTE — Assessment & Plan Note (Signed)
[name] returns to clinic today for followup evaluation.  She reports  that she does need a refill on her Vicodin with the last script being  written in December 2007.  She continues to use minimal amounts, and  used up the 100 over the past 4 months.  She does use zero to 2 tablets  per day.   The patient reports that her blood sugars continue to be in the 200  range.   We have completed paperwork for her for her disability at this point.   MEDICATIONS:  1. Aspirin 325 mg.  2. Regular insulin 40 units q.a.m. and 60 units subcutaneous nightly.  3. Protonix 40 mg.  4. Tricor 140 mg.  5. Lipitor 80 mg.  6. Lasix 40 mg b.i.d.  7. Hydrocodone 5/500 one tablet b.i.d. p.r.n.   REVIEW OF SYSTEMS:  Positive for diarrhea, constipation, fever, chills,  high blood sugars, urinary retention, painful urination and limb  swelling.   PHYSICAL EXAMINATION:  Reasonably well appearing middle aged overweight  adult female in mild acute discomfort.  Blood pressure 146/50 with a pulse of 91.  Respiratory rate 16.  Her O2  saturation is 96% on room air.  She has 5/5 strength throughout the bilateral upper and lower  extremities.   IMPRESSION:  1. 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  2. Healed left ankle stress fracture.   In the office today, we did complete her paperwork for her as noted  above.  We also gave her a refill on the hydrocodone, a total of 100  tablets that she uses sparingly.  We will plan on seeing her in followup  in approximately 4 months' time with refills prior to that appointment  as necessary.           ______________________________  Ellwood Dense, M.D.     DC/MedQ  D:  07/06/2006 09:54:45  T:  07/06/2006 10:26:08  Job #:  [id number]

## 2012-03-06 ENCOUNTER — Ambulatory Visit: Payer: Self-pay | Admitting: Ophthalmology

## 2012-03-06 LAB — POTASSIUM: Potassium: 4 mmol/L (ref 3.5–5.1)

## 2012-03-14 ENCOUNTER — Ambulatory Visit: Payer: Self-pay | Admitting: Ophthalmology

## 2012-03-28 HISTORY — PX: LEG AMPUTATION: SHX1105

## 2013-01-08 DIAGNOSIS — R600 Localized edema: Secondary | ICD-10-CM | POA: Insufficient documentation

## 2013-03-13 ENCOUNTER — Inpatient Hospital Stay: Payer: Self-pay | Admitting: Internal Medicine

## 2013-03-13 LAB — CBC WITH DIFFERENTIAL/PLATELET
Basophil #: 0.1 10*3/uL (ref 0.0–0.1)
Basophil %: 0.9 %
Eosinophil #: 0.1 10*3/uL (ref 0.0–0.7)
Eosinophil %: 0.5 %
HCT: 38.5 % (ref 35.0–47.0)
HGB: 12.5 g/dL (ref 12.0–16.0)
Lymphocyte #: 1.3 10*3/uL (ref 1.0–3.6)
Lymphocyte %: 9.1 %
MCH: 25.9 pg — ABNORMAL LOW (ref 26.0–34.0)
MCHC: 32.4 g/dL (ref 32.0–36.0)
MCV: 80 fL (ref 80–100)
Monocyte #: 0.9 x10 3/mm (ref 0.2–0.9)
Monocyte %: 6.2 %
Neutrophil #: 11.5 10*3/uL — ABNORMAL HIGH (ref 1.4–6.5)
Neutrophil %: 83.3 %
Platelet: 259 10*3/uL (ref 150–440)
RBC: 4.82 10*6/uL (ref 3.80–5.20)
RDW: 17.4 % — ABNORMAL HIGH (ref 11.5–14.5)
WBC: 13.8 10*3/uL — ABNORMAL HIGH (ref 3.6–11.0)

## 2013-03-13 LAB — COMPREHENSIVE METABOLIC PANEL
Albumin: 2 g/dL — ABNORMAL LOW (ref 3.4–5.0)
Alkaline Phosphatase: 126 U/L — ABNORMAL HIGH
Anion Gap: 3 — ABNORMAL LOW (ref 7–16)
BUN: 12 mg/dL (ref 7–18)
Bilirubin,Total: 0.6 mg/dL (ref 0.2–1.0)
Calcium, Total: 9.3 mg/dL (ref 8.5–10.1)
Chloride: 95 mmol/L — ABNORMAL LOW (ref 98–107)
Co2: 32 mmol/L (ref 21–32)
Creatinine: 0.94 mg/dL (ref 0.60–1.30)
EGFR (African American): >60
EGFR (Non-African Amer.): >60
Glucose: 313 mg/dL — ABNORMAL HIGH (ref 65–99)
Osmolality: 272 (ref 275–301)
Potassium: 4.6 mmol/L (ref 3.5–5.1)
SGOT(AST): 19 U/L (ref 15–37)
SGPT (ALT): 15 U/L (ref 12–78)
Sodium: 130 mmol/L — ABNORMAL LOW (ref 136–145)
Total Protein: 7.3 g/dL (ref 6.4–8.2)

## 2013-03-14 LAB — CBC WITH DIFFERENTIAL/PLATELET
Eosinophil #: 0.1 10*3/uL (ref 0.0–0.7)
HCT: 33.5 % — ABNORMAL LOW (ref 35.0–47.0)
Lymphocyte %: 14.9 %
MCH: 25.5 pg — ABNORMAL LOW (ref 26.0–34.0)
MCHC: 32.2 g/dL (ref 32.0–36.0)
Monocyte #: 1 x10 3/mm — ABNORMAL HIGH (ref 0.2–0.9)
RBC: 4.23 10*6/uL (ref 3.80–5.20)
RDW: 16.8 % — ABNORMAL HIGH (ref 11.5–14.5)

## 2013-03-14 LAB — BASIC METABOLIC PANEL
Anion Gap: 5 — ABNORMAL LOW (ref 7–16)
Calcium, Total: 8 mg/dL — ABNORMAL LOW (ref 8.5–10.1)
Co2: 29 mmol/L (ref 21–32)
Creatinine: 0.79 mg/dL (ref 0.60–1.30)
EGFR (African American): >60
EGFR (Non-African Amer.): >60
Glucose: 112 mg/dL — ABNORMAL HIGH (ref 65–99)
Osmolality: 270 (ref 275–301)
Sodium: 135 mmol/L — ABNORMAL LOW (ref 136–145)

## 2013-03-15 LAB — CBC WITH DIFFERENTIAL/PLATELET
Basophil #: 0.1 10*3/uL (ref 0.0–0.1)
Eosinophil #: 0.2 10*3/uL (ref 0.0–0.7)
Eosinophil %: 1.5 %
HCT: 34.2 % — ABNORMAL LOW (ref 35.0–47.0)
HGB: 11.1 g/dL — ABNORMAL LOW (ref 12.0–16.0)
Lymphocyte #: 2 10*3/uL (ref 1.0–3.6)
Lymphocyte %: 16.7 %
MCHC: 32.6 g/dL (ref 32.0–36.0)
MCV: 79 fL — ABNORMAL LOW (ref 80–100)
Monocyte #: 0.8 x10 3/mm (ref 0.2–0.9)
Monocyte %: 6.8 %
Neutrophil #: 9 10*3/uL — ABNORMAL HIGH (ref 1.4–6.5)
Neutrophil %: 74.1 %
Platelet: 244 10*3/uL (ref 150–440)

## 2013-03-15 LAB — BASIC METABOLIC PANEL
Calcium, Total: 8.5 mg/dL (ref 8.5–10.1)
Chloride: 100 mmol/L (ref 98–107)
Co2: 29 mmol/L (ref 21–32)
Creatinine: 0.81 mg/dL (ref 0.60–1.30)
EGFR (African American): >60
EGFR (Non-African Amer.): >60
Osmolality: 268 (ref 275–301)

## 2013-03-17 LAB — CBC WITH DIFFERENTIAL/PLATELET
Basophil #: 0.1 10*3/uL (ref 0.0–0.1)
Basophil %: 0.9 %
Eosinophil #: 0.3 10*3/uL (ref 0.0–0.7)
MCHC: 31.5 g/dL — ABNORMAL LOW (ref 32.0–36.0)
MCV: 79 fL — ABNORMAL LOW (ref 80–100)
Monocyte #: 0.6 x10 3/mm (ref 0.2–0.9)
Monocyte %: 8.2 %
Neutrophil #: 4.7 10*3/uL (ref 1.4–6.5)
Platelet: 258 10*3/uL (ref 150–440)
RDW: 17.1 % — ABNORMAL HIGH (ref 11.5–14.5)
WBC: 7.6 10*3/uL (ref 3.6–11.0)

## 2013-03-17 LAB — VANCOMYCIN, TROUGH: Vancomycin, Trough: 5 ug/mL — ABNORMAL LOW (ref 10–20)

## 2013-03-18 LAB — CREATININE, SERUM
Creatinine: 0.82 mg/dL (ref 0.60–1.30)
EGFR (African American): >60
EGFR (Non-African Amer.): >60

## 2013-03-18 LAB — CULTURE, BLOOD (SINGLE)

## 2013-03-19 LAB — CBC WITH DIFFERENTIAL/PLATELET
Basophil %: 0.7 %
Eosinophil %: 3.4 %
HCT: 29.7 % — ABNORMAL LOW (ref 35.0–47.0)
HGB: 9.7 g/dL — ABNORMAL LOW (ref 12.0–16.0)
Lymphocyte %: 15.5 %
MCH: 26 pg (ref 26.0–34.0)
MCHC: 32.5 g/dL (ref 32.0–36.0)
MCV: 80 fL (ref 80–100)
Monocyte %: 6 %
Neutrophil #: 7.9 10*3/uL — ABNORMAL HIGH (ref 1.4–6.5)
RBC: 3.72 10*6/uL — ABNORMAL LOW (ref 3.80–5.20)
RDW: 17.5 % — ABNORMAL HIGH (ref 11.5–14.5)
WBC: 10.7 10*3/uL (ref 3.6–11.0)

## 2013-03-19 LAB — CREATININE, SERUM
Creatinine: 0.89 mg/dL (ref 0.60–1.30)
EGFR (African American): >60
EGFR (Non-African Amer.): >60

## 2013-03-20 LAB — CBC WITH DIFFERENTIAL/PLATELET
Basophil #: 0.1 10*3/uL (ref 0.0–0.1)
Basophil %: 0.6 %
Eosinophil #: 0.3 10*3/uL (ref 0.0–0.7)
Eosinophil %: 3 %
HCT: 29 % — ABNORMAL LOW (ref 35.0–47.0)
HGB: 9.4 g/dL — ABNORMAL LOW (ref 12.0–16.0)
Lymphocyte %: 20.3 %
MCH: 26 pg (ref 26.0–34.0)
Monocyte #: 0.7 x10 3/mm (ref 0.2–0.9)
Monocyte %: 6.7 %
Neutrophil %: 69.4 %
RBC: 3.63 10*6/uL — ABNORMAL LOW (ref 3.80–5.20)
WBC: 9.7 10*3/uL (ref 3.6–11.0)

## 2013-03-20 LAB — BASIC METABOLIC PANEL
Anion Gap: 5 — ABNORMAL LOW (ref 7–16)
BUN: 8 mg/dL (ref 7–18)
Calcium, Total: 8 mg/dL — ABNORMAL LOW (ref 8.5–10.1)
Co2: 30 mmol/L (ref 21–32)
Creatinine: 0.94 mg/dL (ref 0.60–1.30)
EGFR (Non-African Amer.): >60
Osmolality: 275 (ref 275–301)
Sodium: 138 mmol/L (ref 136–145)

## 2013-03-20 LAB — PATHOLOGY REPORT

## 2013-03-23 LAB — BASIC METABOLIC PANEL
Anion Gap: 3 — ABNORMAL LOW (ref 7–16)
BUN: 12 mg/dL (ref 7–18)
Calcium, Total: 8.4 mg/dL — ABNORMAL LOW (ref 8.5–10.1)
Chloride: 104 mmol/L (ref 98–107)
Co2: 32 mmol/L (ref 21–32)
EGFR (African American): >60
EGFR (Non-African Amer.): 55 — ABNORMAL LOW
Glucose: 132 mg/dL — ABNORMAL HIGH (ref 65–99)
Osmolality: 279 (ref 275–301)
Potassium: 3.4 mmol/L — ABNORMAL LOW (ref 3.5–5.1)

## 2013-03-23 LAB — HEMOGLOBIN: HGB: 10 g/dL — ABNORMAL LOW (ref 12.0–16.0)

## 2013-04-18 LAB — PATHOLOGY REPORT

## 2013-09-25 DIAGNOSIS — G894 Chronic pain syndrome: Secondary | ICD-10-CM | POA: Insufficient documentation

## 2013-09-25 DIAGNOSIS — M961 Postlaminectomy syndrome, not elsewhere classified: Secondary | ICD-10-CM | POA: Insufficient documentation

## 2013-09-25 DIAGNOSIS — Z79899 Other long term (current) drug therapy: Secondary | ICD-10-CM | POA: Insufficient documentation

## 2014-02-28 ENCOUNTER — Inpatient Hospital Stay: Payer: Self-pay | Admitting: Internal Medicine

## 2014-02-28 LAB — CK TOTAL AND CKMB (NOT AT ARMC)
CK, Total: 105 U/L (ref 26–192)
CK-MB: 8 ng/mL — ABNORMAL HIGH (ref 0.5–3.6)

## 2014-02-28 LAB — CBC
HCT: 42.3 % (ref 35.0–47.0)
HGB: 13.7 g/dL (ref 12.0–16.0)
MCH: 27 pg (ref 26.0–34.0)
MCHC: 32.5 g/dL (ref 32.0–36.0)
MCV: 83 fL (ref 80–100)
Platelet: 184 10*3/uL (ref 150–440)
RBC: 5.09 10*6/uL (ref 3.80–5.20)
RDW: 16.1 % — AB (ref 11.5–14.5)
WBC: 13.7 10*3/uL — AB (ref 3.6–11.0)

## 2014-02-28 LAB — URINALYSIS, COMPLETE
Bilirubin,UR: NEGATIVE
Glucose,UR: >=500 mg/dL (ref 0–75)
Ketone: NEGATIVE
Nitrite: NEGATIVE
Ph: 6 (ref 4.5–8.0)
Protein: 100
RBC,UR: 34 /HPF (ref 0–5)
SPECIFIC GRAVITY: 1.016 (ref 1.003–1.030)
SQUAMOUS EPITHELIAL: NONE SEEN
WBC UR: 61 /HPF (ref 0–5)

## 2014-02-28 LAB — DRUG SCREEN, URINE
AMPHETAMINES, UR SCREEN: NEGATIVE (ref ?–1000)
BARBITURATES, UR SCREEN: NEGATIVE (ref ?–200)
Benzodiazepine, Ur Scrn: NEGATIVE (ref ?–200)
CANNABINOID 50 NG, UR ~~LOC~~: NEGATIVE (ref ?–50)
COCAINE METABOLITE, UR ~~LOC~~: NEGATIVE (ref ?–300)
MDMA (Ecstasy)Ur Screen: NEGATIVE (ref ?–500)
METHADONE, UR SCREEN: NEGATIVE (ref ?–300)
Opiate, Ur Screen: NEGATIVE (ref ?–300)
PHENCYCLIDINE (PCP) UR S: NEGATIVE (ref ?–25)
TRICYCLIC, UR SCREEN: NEGATIVE (ref ?–1000)

## 2014-02-28 LAB — COMPREHENSIVE METABOLIC PANEL
Albumin: 3 g/dL — ABNORMAL LOW (ref 3.4–5.0)
Alkaline Phosphatase: 90 U/L
Anion Gap: 10 (ref 7–16)
BUN: 15 mg/dL (ref 7–18)
Bilirubin,Total: 0.3 mg/dL (ref 0.2–1.0)
CALCIUM: 8.6 mg/dL (ref 8.5–10.1)
CO2: 23 mmol/L (ref 21–32)
CREATININE: 0.64 mg/dL (ref 0.60–1.30)
Chloride: 109 mmol/L — ABNORMAL HIGH (ref 98–107)
EGFR (Non-African Amer.): >60
GLUCOSE: 88 mg/dL (ref 65–99)
Osmolality: 283 (ref 275–301)
POTASSIUM: 3.4 mmol/L — AB (ref 3.5–5.1)
SGOT(AST): 23 U/L (ref 15–37)
SGPT (ALT): 16 U/L
Sodium: 142 mmol/L (ref 136–145)
TOTAL PROTEIN: 7.1 g/dL (ref 6.4–8.2)

## 2014-02-28 LAB — PROTIME-INR
INR: 1
PROTHROMBIN TIME: 12.9 s (ref 11.5–14.7)

## 2014-02-28 LAB — TROPONIN I: Troponin-I: 0.26 ng/mL — ABNORMAL HIGH

## 2014-03-01 LAB — BASIC METABOLIC PANEL
Anion Gap: 10 (ref 7–16)
BUN: 9 mg/dL (ref 7–18)
CALCIUM: 8.4 mg/dL — AB (ref 8.5–10.1)
CO2: 21 mmol/L (ref 21–32)
CREATININE: 0.62 mg/dL (ref 0.60–1.30)
Chloride: 115 mmol/L — ABNORMAL HIGH (ref 98–107)
EGFR (African American): >60
EGFR (Non-African Amer.): >60
Glucose: 67 mg/dL (ref 65–99)
Osmolality: 287 (ref 275–301)
POTASSIUM: 3.1 mmol/L — AB (ref 3.5–5.1)
SODIUM: 146 mmol/L — AB (ref 136–145)

## 2014-03-01 LAB — CBC WITH DIFFERENTIAL/PLATELET
Basophil #: 0 10*3/uL (ref 0.0–0.1)
Basophil %: 0.2 %
EOS PCT: 0.7 %
Eosinophil #: 0.1 10*3/uL (ref 0.0–0.7)
HCT: 36.7 % (ref 35.0–47.0)
HGB: 12.2 g/dL (ref 12.0–16.0)
Lymphocyte #: 1.4 10*3/uL (ref 1.0–3.6)
Lymphocyte %: 11.1 %
MCH: 27.4 pg (ref 26.0–34.0)
MCHC: 33.2 g/dL (ref 32.0–36.0)
MCV: 83 fL (ref 80–100)
MONO ABS: 0.5 x10 3/mm (ref 0.2–0.9)
Monocyte %: 4.1 %
NEUTROS ABS: 10.5 10*3/uL — AB (ref 1.4–6.5)
Neutrophil %: 83.9 %
Platelet: 191 10*3/uL (ref 150–440)
RBC: 4.43 10*6/uL (ref 3.80–5.20)
RDW: 15.9 % — AB (ref 11.5–14.5)
WBC: 12.5 10*3/uL — ABNORMAL HIGH (ref 3.6–11.0)

## 2014-03-01 LAB — CK-MB
CK-MB: 8 ng/mL — AB (ref 0.5–3.6)
CK-MB: 6.2 ng/mL — AB (ref 0.5–3.6)

## 2014-03-01 LAB — CK TOTAL AND CKMB (NOT AT ARMC)
CK, Total: 298 U/L — ABNORMAL HIGH (ref 26–192)
CK-MB: 4.9 ng/mL — ABNORMAL HIGH (ref 0.5–3.6)

## 2014-03-01 LAB — TROPONIN I
TROPONIN-I: 0.42 ng/mL — AB
TROPONIN-I: 0.34 ng/mL — AB
TROPONIN-I: 0.27 ng/mL — AB

## 2014-03-02 LAB — URINE CULTURE

## 2014-03-04 LAB — BASIC METABOLIC PANEL
Anion Gap: 5 — ABNORMAL LOW (ref 7–16)
BUN: 15 mg/dL (ref 7–18)
Calcium, Total: 8.8 mg/dL (ref 8.5–10.1)
Chloride: 109 mmol/L — ABNORMAL HIGH (ref 98–107)
Co2: 27 mmol/L (ref 21–32)
Creatinine: 0.68 mg/dL (ref 0.60–1.30)
EGFR (Non-African Amer.): >60
GLUCOSE: 173 mg/dL — AB (ref 65–99)
Osmolality: 286 (ref 275–301)
Potassium: 3.2 mmol/L — ABNORMAL LOW (ref 3.5–5.1)
Sodium: 141 mmol/L (ref 136–145)

## 2014-03-04 LAB — MAGNESIUM: MAGNESIUM: 2 mg/dL

## 2014-03-05 ENCOUNTER — Encounter: Payer: Self-pay | Admitting: Internal Medicine

## 2014-03-05 LAB — CULTURE, BLOOD (SINGLE)

## 2014-03-22 ENCOUNTER — Observation Stay: Payer: Self-pay | Admitting: Internal Medicine

## 2014-03-22 LAB — URINALYSIS, COMPLETE
Bilirubin,UR: NEGATIVE
KETONE: NEGATIVE
Nitrite: NEGATIVE
Ph: 5 (ref 4.5–8.0)
Protein: 30
Specific Gravity: 1.019 (ref 1.003–1.030)
WBC UR: 25 /HPF (ref 0–5)

## 2014-03-22 LAB — CBC
HCT: 41.2 % (ref 35.0–47.0)
HGB: 13.5 g/dL (ref 12.0–16.0)
MCH: 27.6 pg (ref 26.0–34.0)
MCHC: 32.7 g/dL (ref 32.0–36.0)
MCV: 84 fL (ref 80–100)
Platelet: 196 10*3/uL (ref 150–440)
RBC: 4.88 10*6/uL (ref 3.80–5.20)
RDW: 16 % — AB (ref 11.5–14.5)
WBC: 9.7 10*3/uL (ref 3.6–11.0)

## 2014-03-22 LAB — TROPONIN I: Troponin-I: <0.02 ng/mL

## 2014-03-22 LAB — COMPREHENSIVE METABOLIC PANEL
ALBUMIN: 3.6 g/dL (ref 3.4–5.0)
ALK PHOS: 68 U/L
ALT: 20 U/L
AST: 22 U/L (ref 15–37)
Anion Gap: 11 (ref 7–16)
BUN: 32 mg/dL — AB (ref 7–18)
Bilirubin,Total: 0.3 mg/dL (ref 0.2–1.0)
CALCIUM: 8.9 mg/dL (ref 8.5–10.1)
CO2: 24 mmol/L (ref 21–32)
CREATININE: 0.97 mg/dL (ref 0.60–1.30)
Chloride: 107 mmol/L (ref 98–107)
EGFR (Non-African Amer.): >60
Glucose: 37 mg/dL — CL (ref 65–99)
OSMOLALITY: 287 (ref 275–301)
Potassium: 3.6 mmol/L (ref 3.5–5.1)
Sodium: 142 mmol/L (ref 136–145)
Total Protein: 7.2 g/dL (ref 6.4–8.2)

## 2014-03-22 LAB — LIPASE, BLOOD: LIPASE: 51 U/L — AB (ref 73–393)

## 2014-03-23 LAB — BASIC METABOLIC PANEL
Anion Gap: 7 (ref 7–16)
BUN: 20 mg/dL — AB (ref 7–18)
CALCIUM: 8.7 mg/dL (ref 8.5–10.1)
CO2: 26 mmol/L (ref 21–32)
CREATININE: 0.87 mg/dL (ref 0.60–1.30)
Chloride: 106 mmol/L (ref 98–107)
EGFR (African American): >60
EGFR (Non-African Amer.): >60
Glucose: 237 mg/dL — ABNORMAL HIGH (ref 65–99)
Osmolality: 288 (ref 275–301)
Potassium: 4 mmol/L (ref 3.5–5.1)
Sodium: 139 mmol/L (ref 136–145)

## 2014-03-23 LAB — URINE CULTURE

## 2014-03-28 ENCOUNTER — Encounter: Payer: Self-pay | Admitting: Internal Medicine

## 2014-04-06 LAB — URINALYSIS, COMPLETE
Bilirubin,UR: NEGATIVE
Blood: NEGATIVE
Glucose,UR: 150 mg/dL (ref 0–75)
KETONE: NEGATIVE
Nitrite: NEGATIVE
PROTEIN: NEGATIVE
Ph: 6 (ref 4.5–8.0)
RBC,UR: 6 /HPF (ref 0–5)
Specific Gravity: 1.009 (ref 1.003–1.030)
Squamous Epithelial: 4
WBC UR: 156 /HPF (ref 0–5)

## 2014-04-09 LAB — URINE CULTURE

## 2014-06-05 DIAGNOSIS — R471 Dysarthria and anarthria: Secondary | ICD-10-CM | POA: Diagnosis not present

## 2014-06-05 DIAGNOSIS — R262 Difficulty in walking, not elsewhere classified: Secondary | ICD-10-CM | POA: Diagnosis not present

## 2014-06-05 DIAGNOSIS — E1140 Type 2 diabetes mellitus with diabetic neuropathy, unspecified: Secondary | ICD-10-CM | POA: Diagnosis not present

## 2014-06-25 DIAGNOSIS — R471 Dysarthria and anarthria: Secondary | ICD-10-CM | POA: Diagnosis not present

## 2014-06-25 DIAGNOSIS — E1165 Type 2 diabetes mellitus with hyperglycemia: Secondary | ICD-10-CM | POA: Diagnosis not present

## 2014-06-25 DIAGNOSIS — E118 Type 2 diabetes mellitus with unspecified complications: Secondary | ICD-10-CM | POA: Diagnosis not present

## 2014-07-01 ENCOUNTER — Encounter
Admit: 2014-07-01 | Disposition: A | Discharge status: Home / Self Care | Payer: Self-pay | Attending: Internal Medicine | Admitting: Internal Medicine

## 2014-07-01 DIAGNOSIS — R569 Unspecified convulsions: Secondary | ICD-10-CM | POA: Diagnosis not present

## 2014-07-01 DIAGNOSIS — R4701 Aphasia: Secondary | ICD-10-CM | POA: Diagnosis not present

## 2014-07-01 DIAGNOSIS — G9340 Encephalopathy, unspecified: Secondary | ICD-10-CM | POA: Diagnosis not present

## 2014-07-15 NOTE — Op Note (Signed)
PATIENT NAME:  Kathleen Mcguire, Kathleen Mcguire MR#:  [id number] DATE OF BIRTH:  December 30, 1945  DATE OF PROCEDURE:  03/14/2012  PROCEDURES PERFORMED:  1.  Pars plana vitrectomy of the left eye.  2.  Panretinal photocoagulation of the left eye.   PREOPERATIVE DIAGNOSES:  1.  Nonclearing vitreous hemorrhage.  2.  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   POSTOPERATIVE DIAGNOSES: 1.  Nonclearing vitreous hemorrhage.  2.  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   ESTIMATED BLOOD LOSS: Less than 1 mL.  PRIMARY SURGEON:  Cline CoolsMatthew F Montell Leopard, M.D.   ANESTHESIA: Retrobulbar block of the left eye with monitored anesthesia care.   INDICATIONS FOR PROCEDURE: This is a patient, who presented to my office with loss of vision in her left eye. After allowing several months for vitreous hemorrhage to clear, the patient did not improved. Risks, benefits, and alternatives of the above procedure were discussed and the patient wished to proceed.   DETAILS OF PROCEDURE: After informed consent was obtained, the patient was brought in the operative suite at [facility]. The patient was placed in supine position, was given a small dose of Alfenta  and a retrobulbar block was performed on the left eye by the primary surgeon without any complications. The left eye was prepped and draped in sterile manner. After lid speculum was inserted, a 25-gauge trocar was placed inferotemporally through displaced conjunctiva in an oblique fashion, 3 mm beyond the limbus. The infusion cannula was turned on and inserted through the trocar and secured in position with Steri-Strips. Two more trocars were placed in a similar fashion superotemporally and superonasally. Vitreous cutter and light pipe were introduced in the eye and a core vitrectomy was performed. Vitreous face was confirmed to already be elevated and the vitreous body was removed centrally and the periphery was trimmed for 360 degrees. The  inferior vitreous base was visualized using direct visualization and the vitreous base was trimmed as much as possible under scleral depression to remove as much blood as safely possible. Once the peripheral vitreous was trimmed, endolaser was introduced in the eye and 360 degrees of panretinal photocoagulation was performed with sparing of the ciliary nerves. An 75% air-fluid exchange was performed. The trocars were removed and the wounds were noted to be airtight. No signs of any breaks or tears could be identified on 360 degrees scleral depression just prior to removal of trocars. The pressure in the eye was confirmed to be grossly 15 mmHg and 5 mg of dexamethasone was given into the inferior fornix. The lid speculum was removed and the eye was cleaned. TobraDex was placed in the eye and a patch and shield were placed over the eye. The patient was taken to postanesthesia care with instructions to remain head up.   ____________________________ Ignacia FellingMatthew F. Champ MungoAppenzeller, MD mfa:aw D: 03/14/2012 09:20:56 ET T: 03/14/2012 12:31:27 ET JOB#: [id number]  cc: Ignacia FellingMatthew F. Champ MungoAppenzeller, MD, <Dictator> Cline CoolsMATTHEW F Shandy Vi MD ELECTRONICALLY SIGNED 03/19/2012 17:45

## 2014-07-16 DIAGNOSIS — R569 Unspecified convulsions: Secondary | ICD-10-CM | POA: Diagnosis not present

## 2014-07-16 DIAGNOSIS — G9340 Encephalopathy, unspecified: Secondary | ICD-10-CM | POA: Diagnosis not present

## 2014-07-16 DIAGNOSIS — R4701 Aphasia: Secondary | ICD-10-CM | POA: Diagnosis not present

## 2014-07-18 NOTE — Discharge Summary (Signed)
PATIENT NAME:  Kathleen Mcguire, Kathleen Mcguire MR#:  [id number] DATE OF BIRTH:  1945/08/27  DATE OF ADMISSION:  03/13/2013 DATE OF DISCHARGE:  03/23/2013   ADMISSION DIAGNOSIS: Foot cellulitis.   DISCHARGE DIAGNOSES: 1.  Gangrene of the left toe, status post guillotine amputation for infection. 2.  Left foot osteomyelitis, status post left above-knee amputation. 3.  Uncontrolled diabetes.  4.  Hypertension.  5.  Hyperlipidemia.  6.  Peripheral vascular disease, status post previous revascularization.   CONSULTATIONS:  1.  Dr. Wyn Quakerew. 2.  Dr. Ether GriffinsFowler.  PROCEDURES: The patient underwent a guillotine amputation and a left above-knee amputation.   LABORATORY AND RADIOLOGICAL DATA AT DISCHARGE: Sodium 139, potassium 3.4, chloride 104, bicarbonate 32, BUN 12, creatinine 1.05, glucose is 132. White blood cells 9.1. Blood cultures were negative to date.   MRI on 03/14/2013 showed: 1.  Large irregular abscess involving the plantar aspect of the forefoot with associated extensive soft tissue emphysema.  2.  Osteomyelitis of the fourth toe, possibly involving the fifth toe, and has a fourth and fifth metatarsal.   HOSPITAL COURSE: This is a 69 year old female with uncontrolled diabetes who initially presented with left foot infection. For further details, please refer to the H and P.  1.  Left foot osteomyelitis and gangrene. Initially, the patient had a terrible smelling foot, very necrotic in nature consistent with gangrene. She was placed on broad-spectrum antibiotics. I consulted Dr. Ether GriffinsFowler and Dr. Wyn Quakerew. She underwent an amputation of her foot on Friday, December 19, and then she underwent a left above-knee amputation on March 18, 2013. The patient will have followup with Dr. Wyn Quakerew as an outpatient. His recommendation is to leave the dressing on with standard dressing care changes.  2.  Uncontrolled diabetes. The patient is on Levemir and sliding scale insulin, ADA diet. Her blood sugars initially were really  elevated due to the infection. These have improved.  3.  Hypertension. The patient will continue outpatient medications.  4.  Hypokalemia, which was repleted p.r.n.   DISCHARGE MEDICATIONS: 1.  Niaspan ER 500 mg 2 tablets daily at lunch.  2.  Losartan 100 mg daily.  3.  Lasix 20 mg daily.  4.  Lipitor 20 mg daily.  5.  Vitamin D3, 1000 international units daily.  6.  Aspirin 325 mg daily.  7.  Allegra 180 mg daily.  8.  Multivitamin 1 tablet daily.  9.  Insulin detemir 30 units twice a day.  10.  Sliding scale insulin.  11.  Norvasc 2.5 mg 2 tablets daily.  12.  Docusate 100 mg b.i.d. p.r.n.  13.  Acetaminophen 325, 2 tablets q.4 hours p.r.n. pain.  14.  Oxycodone 10 mg q.4 hours p.r.n. pain.  15.  Nystatin t.i.d.   DISCHARGE DIET: Low-sodium, ADA diet.   DISCHARGE ACTIVITY: As tolerated.   DISCHARGE FOLLOWUP: The patient needs to follow up with Dr. Wyn Quakerew in 1 week, as well as Dr. Gwyneth RevelsJustin Fowler.   CODE STATUS: The patient is full code status. The patient is medically stable for discharge.   TIME SPENT: Approximately 35 minutes.  ____________________________ Janyth ContesSital P. Juliene PinaMody, MD spm:jcm D: 03/23/2013 12:44:13 ET T: 03/23/2013 13:08:12 ET JOB#: [id number]  cc: Nasya Vincent P. Juliene PinaMody, MD, <Dictator> Janyth ContesSITAL P Alexcis Bicking MD ELECTRONICALLY SIGNED 03/23/2013 14:34

## 2014-07-18 NOTE — Consult Note (Signed)
PATIENT NAME:  Kathleen Mcguire, Marjani W MR#:  [id number] DATE OF BIRTH:  1945-11-06  DATE OF CONSULTATION:  03/14/2013  REFERRING PHYSICIAN:  Prime Doc  CONSULTING PHYSICIAN:  Annice NeedyJason S. Dew, MD  PRIMARY CARE PHYSICIAN: Corky DownsJaved Masoud, MD  PODIATRIST: Gwyneth RevelsJustin Fowler, MD  REASON FOR CONSULTATION: Left foot pain.  HISTORY OF PRESENT ILLNESS: This is a 69 year old female with past medical history of poorly controlled type 2 diabetes. She has had chronic diabetic foot ulcer. Several years ago I had seen her and performed revascularization of the left lower extremity and she had a chronic wound which was treated on and off for many months. She returns with a 2 day history of left foot pain and discoloration. Her toes have turned black. She is having foul-smelling drainage from the foot. The pain is about 8 or 9 out of 10. There are really no worsening or alleviating factors. She has not had any fevers or chills prior to admission, although she did have a fever of 101 here. Her white count is mildly elevated at 12,000 today. She has noticed that the foot is softer and having more drainage. She had not had any syncope or chest pain.   PAST MEDICAL HISTORY: Significant for: 1.  Diabetes mellitus. 2.  Peripheral vascular disease status post previous intervention.  3.  Hypertension.  4.  Hyperlipidemia.  5.  Fibromyalgia.   PAST SURGICAL HISTORY: Includes multiple foot debridements and left lower extremity revascularization.   SOCIAL HISTORY: Previous tobacco use, none currently. No alcohol or drug use. Lives at home.   FAMILY HISTORY: Positive for multiple family members with heart disease and diabetes.  ALLERGIES: LATEX.   HOME MEDICATIONS: Include:  1.  Allegra 180 mg daily.  2.  Aspirin 325 mg daily.  3.  Lasix 20 mg daily. 4.  Lipitor 20 mg daily.  5.  Losartan 100 mg daily.  6.  Multivitamin daily. 7.  Niaspan 2 tablets daily.  8.  NPH insulin 30 units b.i.d.  9.  Regular insulin 40 units b.i.d.   10.  Vitamin D 1000 international units daily.  REVIEW OF SYSTEMS:  GENERAL: She has had a low-grade fever here, but had not at home. No fatigue or weakness.  EYES: No blurred or double vision.  EARS: No tinnitus or ear pain.  RESPIRATORY: No shortness of breath or cough.  CARDIAC: No chest pain or palpitations.  GASTROINTESTINAL: No nausea, vomiting, diarrhea, or abdominal pain.  GENITOURINARY: No dysuria or hematuria.  ENDOCRINE: No heat or cold intolerance.  PSYCHIATRIC: No anxiety or depression. HEME: No easy bruising or bleeding. SKIN: Left foot wound as described above.  MUSCULOSKELETAL: Lower extremity swelling.  NEUROLOGIC: No TIA, stroke, or seizure.  PHYSICAL EXAMINATION: GENERAL: An obese white female sitting in be, not in obvious distress. VITAL SIGNS: T-max is 101.2. Her current temperature is 99. Heart rate is 93. Blood pressure is 142/59 and saturation is 93%.  HEENT: Normocephalic, atraumatic. Eyes: Sclerae anicteric. Conjunctivae are clear. Ears: Normal external appearance. Hearing is intact. NECK: Supple without adenopathy or JVD.  HEART: Regular rate and rhythm without murmurs, rubs, or gallops.  LUNGS: Clear to auscultation bilaterally with no accessory muscle use. Nonlabored breathing. ABDOMEN: Obese, nondistended, nontender. EXTREMITIES: She has mild to moderate right lower extremity edema, moderate to severe left lower extremity edema. Her left foot is grossly infected with significant purulent drainage and fluctuance to the mid foot. Several toes are black and gangrenous. She has multiple calf ulcerations as well. The foot  is very swollen and it is tender to palpation. The right calf has some superficial ulcerations. She does have a weakly palpable dorsalis pedis pulse on the left and the right.  NEUROLOGIC: Normal strength and tone in all 4 extremities, diminished sensation in lower extremities and in the fingers.  SKIN: Wound as described above.   LABORATORY  AND DIAGNOSTICS: Sodium 135, potassium 3.6, chloride 101, CO2 29, BUN 10, creatinine 0.79, and glucose 112. White blood cell count is 12.1, hemoglobin 10.8, and platelet count is 246,000.  She has a plain x-ray which shows gas in the foot that I have independently reviewed. She also has a MRI which shows gas in the foot and clear abscess and infection. I have reviewed her imaging studies.  ASSESSMENT AND PLAN: This is a 69 year old white female with a diabetic foot infection, peripheral vascular disease, and gangrene of the left foot. I have seen the patient along with Dr. Ether Griffins and her foot is not salvageable. Her foot is grossly infected and there is purulent abscess present and a primary amputation is not safe until the infection is cleared. She has really no hope for foot salvage. I do not think she will heal a below-the-knee amputation given the skin changes in the left lower extremity and the chronic stasis ulcerations that are present. I think she is more likely going to need an above-knee amputation, but we will make this determination after her foot infection is cleared. She will need guillotine amputation to help clear this infection. I have discussed with Dr. Ether Griffins and the mid foot may not even be high enough for this. We will plan an ankle guillotine amputation tomorrow. This is clearly a limb loss situation and the patient is aware of this.  This is a level 5 consultation. ____________________________ Annice Needy, MD jsd:sb D: 03/15/2013 16:24:00 ET T: 03/15/2013 16:52:00 ET JOB#: [id number]  cc: Annice Needy, MD, <Dictator> Annice Needy MD ELECTRONICALLY SIGNED 03/18/2013 10:16

## 2014-07-18 NOTE — H&P (Signed)
PATIENT NAME:  Kathleen Mcguire, WARSAME MR#:  [id number] DATE OF BIRTH:  April 21, 1945  DATE OF ADMISSION:  03/13/2013  REFERRING PHYSICIAN: Dr. Jasmine December.   PRIMARY CARE PHYSICIAN: Dr. Lavera Guise.   CHIEF COMPLAINT: Left foot pain.  HISTORY OF PRESENT ILLNESS:  This is a 69 year old Caucasian female with past medical history of type 2 diabetes which is poorly controlled, complicated by diabetic foot ulcer, as well as peripheral vascular disease, who is presenting with left foot pain. She describes a 4-day duration of left foot pain, dull, burning, 8 out of 10 in intensity, nonradiating, no worsening or relieving factors, with associated foul odor to discharge which is a black-greenish. However, she denies any fevers or chills. States that symptoms have been present in total for approximately 1 week, though pain has been worsening over the last 4 days. She is presenting today as opposed to previously because "the mushiness is worse." She has been dealing with complications of this since 2010 per her recollection. Currently, she is complaining of pain over the left foot as described above. Otherwise, no further complaints.   REVIEW OF SYSTEMS:    CONSTITUTIONAL: Denies fevers, chills, fatigue, weakness.  EYES: Denies blurred vision, double vision, eye pain.  EARS, NOSE, THROAT: Denies tinnitus, ear pain, hearing loss.  RESPIRATORY: Denies cough, wheeze, shortness of breath.  CARDIOVASCULAR: Denies chest pain, palpitations. Positive for edema which is chronic.  GASTROINTESTINAL: Denies nausea, vomiting, diarrhea, abdominal pain.  GENITOURINARY: Denies dysuria, hematuria.  ENDOCRINE: Denies nocturia or polyuria.  HEMATOLOGIC AND LYMPHATIC: Denies easy bruising or bleeding.  SKIN: Positive for left foot lesion as described above.  MUSCULOSKELETAL: Currently denies any pain in neck, back, shoulder, knees or hips, any arthritic symptoms.  NEUROLOGIC: Denies any paralysis or paresthesias.  PSYCHIATRIC: Denies any  anxiety or depressive symptoms.   Otherwise, full review of systems performed by me is negative.   PAST MEDICAL HISTORY: Positive for hypertension; type 2 diabetes, poorly controlled, complicated by diabetic foot ulceration; peripheral vascular disease; hyperlipidemia; fibromyalgia; chronic lumbago.   SOCIAL HISTORY: Ex-tobacco use. Denies any alcohol or drug usage.   FAMILY HISTORY: Positive for congestive heart failure and type 2 diabetes.   ALLERGIES: LATEX.   HOME MEDICATIONS: Include Allegra 180 mg p.o. daily, aspirin 325 mg p.o. daily, Lasix 20 mg p.o. daily, Lipitor 20 mg p.o. daily, losartan 100 mg p.o. daily, multivitamin 1 tablet p.o. daily, Niaspan extended-release 2 tablets p.o. daily, NPH 30 units subcutaneous b.i.d., regular insulin 40 units b.i.d. with meals, vitamin D3 at 1000 international units p.o. daily.   PHYSICAL EXAMINATION: VITAL SIGNS: Temperature 98.1, heart rate 91, respirations 18, blood pressure 138/61, saturating 95% on room air. Weight 108.9 kg, BMI of 46.9.  GENERAL: Obese Caucasian female who is currently in no acute distress.  HEAD: Normocephalic, atraumatic.  EYES: Pupils equal, round, and reactive to light. Extraocular muscles intact. No scleral icterus.  MOUTH: Moist mucosal membranes. Dentition intact. No abscess noted.  EARS, NOSE, THROAT: Clear without exudates. No external lesions.  NECK: Supple. No thyromegaly. No nodules. No JVD.  PULMONARY: Clear to auscultation bilaterally. No wheezes, rubs or rhonchi. No use of accessory muscles. Good respiratory effort.  CHEST: Nontender to palpation.  CARDIOVASCULAR: S1, S2, III/VI systolic ejection murmur. No rubs or gallops. Pedal edema 2+ bilaterally to the knees. Pedal pulses are diminished bilaterally.  GASTROINTESTINAL: Soft, nontender, nondistended. No masses. Obese. Positive bowel sounds. No hepatosplenomegaly.  MUSCULOSKELETAL: Left lower extremity reveals swelling from the foot to the mid shin. No  clubbing. Edema as described above. The left foot have surrounding erythema. It is also warm to touch. On the lateral portion of the foot, there is a large area of eschar as well as a wound draining black to green foul-smelling discharge. On the medial portion of the left foot, there is an incision or a scar from previous surgeries, which is well healed.  NEUROLOGIC: Cranial nerves II through XII intact. Sensation is greatly diminished to fine touch in both feet bilaterally. Reflexes intact.  SKIN: Left foot ulceration and lesion as described above with associated erythema. Also has bilateral skin changes consistent with chronic venous stasis. There is no cyanosis. Skin is warm and dry. Turgor is intact.  PSYCHIATRIC: Mood and affect are within normal limits. The patient is awake, alert, oriented x 3. Insight and judgment intact.   IMAGING: Left foot x-ray revealing findings consistent with severe cellulitis. There does appear to be some degree of lytic destruction involving the distal fourth and fifth metatarsals and possibly phalanges suggesting osteomyelitis.   LABORATORY DATA: Sodium 130, potassium 4.6, chloride 95, bicarbonate 32, BUN 12, creatinine 0.94, glucose 313, total protein 7.3, albumin 2, total bilirubin 0.6, alkaline phosphatase 126, AST 19, ALT 15. WBC 13.8, hemoglobin 12.5, platelets 259.   ASSESSMENT AND PLAN: A 69 year old Caucasian female with history of poorly controlled diabetes presenting with left foot pain.  1.  Diabetic foot ulcer with possible osteomyelitis. Blood cultures have been obtained. Broad-spectrum antibiotics with vancomycin and Zosyn. Will check ESR and CRPs for trending purposes. Will check an MRI to confirm osteomyelitis as well as the extent. Will need orthopedic input once osteomyelitis confirmed. May possibly need vascular surgery input, as she is well known to their service. Continue with pain control with IV morphine. Will add bowel regimen while she is on pain  medications. 2.  Poorly-controlled type 2 diabetes. Continue with her NPH. Will add insulin sliding scale and adjust as required with goal blood glucose between 120 and 180.  3.  Hypertension. Continue with losartan.  4.  Hyperlipidemia. Continue with Lipitor.  5.  Deep venous thrombosis prophylaxis with heparin subcutaneously.   CODE STATUS: The patient is full code.   TIME SPENT: 45 minutes.   ____________________________ Aaron Mose. Milianna Ericsson, MD dkh:jcm D: 03/13/2013 21:02:57 ET T: 03/13/2013 21:28:00 ET JOB#: [id number]  cc: Aaron Mose. Shayann Garbutt, MD, <Dictator> Charon Smedberg Woodfin Ganja MD ELECTRONICALLY SIGNED 03/14/2013 22:48

## 2014-07-18 NOTE — Op Note (Signed)
PATIENT NAME:  Kathleen Mcguire, Kathleen Mcguire MR#:  [id number] DATE OF BIRTH:  May 04, 1945  DATE OF PROCEDURE:  03/15/2013  PREOPERATIVE DIAGNOSES: 1.  Wet gangrene, left foot.  2.  Non-controlled diabetes mellitus.  3.  Peripheral vascular disease.  4.  Hypertension.  POSTOPERATIVE DIAGNOSES: 1.  Wet gangrene, left foot.  2.  Non-controlled diabetes mellitus.  3.  Peripheral vascular disease.  4.  Hypertension.   PROCEDURE:  Guillotine amputation, left lower leg.   SURGEON:  Festus BarrenJason Dew, M.D.   ASSISTANJenkins Rouge:  Chelsey Haney.   ANESTHESIA:  General.   ESTIMATED BLOOD LOSS:  50 mL.   INDICATION FOR PROCEDURE:  This is a patient who was admitted with clear wet gangrene of the left foot and ongoing infection and abscess.  This was confirmed on plain radiograph and MRI.  She was seen by the podiatrist who did not feel her foot was salvageable and I would agree with his assessment and she is going to have a guillotine amputation to clear the infection prior to a definitive below-knee or above-knee amputation.   DESCRIPTION OF PROCEDURE:  The patient is brought to the operative suite.  The left lower extremity was sterilely prepped and draped and a sterile surgical field was created.  A circular incision was created just above the ankle.  Incision was created with a knife and carried down to the bone, which was transected with the oscillating saw.  She actually had two arterial vessels with pulsatile flow at this level that were ligated with silk suture ligatures and other small veins, a saphenous vein and any other bleeders were controlled with silk ties.  A silver VAC sponge was cut to fit the wound.  Strips of [place] were used for an occlusive seal.  The patient was then awakened from anesthesia and to the recovery room in stable condition.     ____________________________ Annice NeedyJason S. Dew, MD jsd:ea D: 03/15/2013 17:24:02 ET T: 03/16/2013 02:51:13 ET JOB#: [id number]  cc: Annice NeedyJason S. Dew, MD, <Dictator> Annice NeedyJASON S  DEW MD ELECTRONICALLY SIGNED 03/18/2013 10:16

## 2014-07-18 NOTE — Consult Note (Signed)
Details:   - Pt with severe gangrenous left foot. No limb salvage due to amount of severe necrotic and infected tissue. Severe infection and will need lower leg amputation to remove and clear infection and will eventually need AKA as lower leg and calf have marked ulcerations preventing BKA flaps. D/W Vascular and is in full agreement.   Electronic Signatures: Gwyneth RevelsFowler, Jani Moronta (MD)  (Signed 18-Dec-14 17:29)  Authored: Details   Last Updated: 18-Dec-14 17:29 by Gwyneth RevelsFowler, Lerlene Treadwell (MD)

## 2014-07-18 NOTE — Op Note (Signed)
PATIENT NAME:  Kathleen Mcguire, Nadiyah W MR#:  [id number] DATE OF BIRTH:  February 23, 1946  DATE OF PROCEDURE:  03/18/2013  PREOPERATIVE DIAGNOSES: 1.  Gangrene, left foot, status post guillotine amputation for infection.  2.  Diabetes mellitus.  3.  Peripheral vascular disease, status post previous revascularization.   POSTOPERATIVE DIAGNOSES: 1.  Gangrene, left foot, status post guillotine amputation for infection.  2.  Diabetes mellitus.  3.  Peripheral vascular disease, status post previous revascularization.   PROCEDURE:  Left above-knee amputation.   SURGEON:  Annice NeedyJason S. Dew, MD  ANESTHESIA: General.   ESTIMATED BLOOD LOSS: 100 mL.   INDICATION FOR PROCEDURE: A 69 year old white female with a gangrenous and frankly infected left foot. She had a guillotine amputation to clear the infection Friday. She is brought back today for a definitive amputation.   DESCRIPTION OF PROCEDURE: The patient is brought to the operative suite and after an adequate level of general anesthesia was obtained, the left lower extremity was sterilely prepped and draped and a sterile surgical field was created. A fishmouth incision was created just above the patella and we dissected down to the femur. A periosteal elevator was used to take the bone well cephalad to the incision. The bone was then resected with the oscillating saw. The skin incision was completed and the amputation knife was used to complete the posterior flap. The artery and vein were then identified and controlled and ligated with 2-0 silk suture ligatures. The nerve was ligated with 2-0 silk free tie and all other vessels were controlled with either electrocautery or 2-0 silk ties.   The wound was then irrigated. Three interrupted 0 Vicryl sutures were used to close the deep space. The fascia was then closed with interrupted 0 Vicryls. The subcutaneous tissue was closed with 2-0 Vicryl. The skin was coapted with staples and a handful of mattress nylon sutures to  buttress the repair and a sterile dressing was placed. The patient tolerated the procedure well and was taken to the recovery room in stable condition.   ____________________________ Annice NeedyJason S. Dew, MD jsd:cs D: 03/18/2013 13:58:07 ET T: 03/18/2013 14:34:27 ET JOB#: [id number]  cc: Annice NeedyJason S. Dew, MD, <Dictator> Annice NeedyJASON S DEW MD ELECTRONICALLY SIGNED 03/25/2013 8:26

## 2014-07-19 NOTE — Consult Note (Signed)
Referring Physician:  Loletha Grayer :   Primary Care Physician:  Loletha Grayer : [facility], [facility], [address], [place], [place] [address], [phone]  Reason for Consult: Admit Date: 01-Mar-2014  Chief Complaint: confusion  Reason for Consult: confusion   History of Present Illness: History of Present Illness:   69 yo RHD F presents to [facility] secondary to being found down at home.  When she was found, it was noted that she had a very low blood glucose.  Pt denies taking any medication or ever having a seizure.  Pt does not remember any of the episode and did not remember having any warning.  Today pt still feels confused and not quite normal.  Son is at bedside and states that she still is confused and not answering appropiately.  ROS:  General fatigue   HEENT no complaints   Lungs cough  SOB   Cardiac no complaints   GI no complaints   GU no complaints   Musculoskeletal no complaints   Extremities no complaints   Skin no complaints   Neuro no complaints   Endocrine no complaints   Psych no complaints   Past Medical/Surgical Hx:  presbyesophagus: per chart  Reflux: per chart  obesity: per MD  fibromyalgia:   PVD:   chronic lumbago:   MI - Myocardial InfarctX2:   Hypertension:   Hypercholesterolemia:   Diabetes:   Past Medical/ Surgical Hx:  Past Medical History reviewed by me as above   Past Surgical History L AKA   Home Medications: Medication Instructions Last Modified Date/Time  Metoprolol Tartrate 25 mg oral tablet 0.5 tab(s) orally 2 times a day 07-Dec-15 15:53  tiZANidine 4 mg oral tablet 2 tab(s) orally once a day (in the evening), As Needed 07-Dec-15 15:53  insulin aspart 100 units/mL subcutaneous solution  subcutaneous  07-Dec-15 15:53  insulin detemir 100 units/mL subcutaneous solution  subcutaneous  07-Dec-15 15:53  acetaminophen 325 mg oral tablet 2 tab(s) orally every 4 hours, As needed,  mild pain (1-3/10) or temp. greater than 100.4 07-Dec-15 15:53  aspirin 81 mg oral tablet, chewable 1 tab(s) orally once a day 07-Dec-15 15:53  amLODIPine 10 mg oral tablet 1 tab(s) orally once a day 07-Dec-15 15:53  ciprofloxacin 250 mg oral tablet 1 tab(s) orally every 12 hours 07-Dec-15 15:53  losartan 100 mg oral tablet 1 tab(s) orally once a day at lunch 04-Dec-15 17:50  Lasix 40 mg oral tablet 1 tab(s) orally once a day 04-Dec-15 17:50  atorvastatin 40 mg oral tablet 1 tab(s) orally once a day (at bedtime) 04-Dec-15 17:50  topiramate 25 mg oral tablet 1 tab(s) orally once a day (at bedtime) 04-Dec-15 17:50   Allergies:  Latex: Itching  Allergies:  Allergies NKDA   Social/Family History: Employment Status: disabled  Lives With: alone  Living Arrangements: house  Social History: no tob, no EtOH, no illicits  Family History: no seizures, no stroke   Vital Signs: **Vital Signs.:   07-Dec-15 16:12  Vital Signs Type Routine  Temperature Temperature (F) 98.2  Celsius 36.7  Pulse Pulse 78  Respirations Respirations 24  Systolic BP Systolic BP 016  Diastolic BP (mmHg) Diastolic BP (mmHg) 97  Pulse Ox % Pulse Ox % 90  Pulse Ox Activity Level  At rest  Oxygen Delivery Room Air/ 21 %    01:09  Systolic BP Systolic BP 323  Diastolic BP (mmHg) Diastolic BP (mmHg) 81  Mean BP 103  Pulse Ox %  Pulse Ox % 94  Pulse Ox Activity Level  At rest  Oxygen Delivery 2L   Physical Exam: General: obese, mild distress  HEENT: normocephalic, sclera nonicteric, oropharynx clear  Neck: supple, no JVD, no bruits  Chest: mild wheezing with accessory muscle usage  Cardiac: RRR, no murmurs, no edema, 2+ pulses  Extremities: L AKA, diffuse R leg edema   Neurologic Exam: Mental Status: alert but oriented only to person, mild dysarthria, nl language  Cranial Nerves: PERRLA, EOMI, nl VF, face symmetric, tongue midline, shoulder shrug equal  Motor Exam: 5/5 B normal, tone, no tremor  Deep Tendon  Reflexes: 0/4 B, mute R plantar  Sensory Exam: normal temp and decreased vibration below R knee  Coordination: F to N WNL   Lab Results: Hepatic:  04-Dec-15 16:18   Bilirubin, Total 0.3  Alkaline Phosphatase 90 (46-116 NOTE: New Reference Range 10/15/13)  SGPT (ALT) 16 (14-63 NOTE: New Reference Range 10/15/13)  SGOT (AST) 23  Total Protein, Serum 7.1  Albumin, Serum  3.0  Routine Micro:  04-Dec-15 16:47   Organism Name Escherichia coli  Organism Quantity >100,000 CFU/ML  Nitrofurantoin Sensitivity S  Cefazolin Sensitivity S  Ampicillin Sensitivity S  Ceftriaxone Sensitivity S  Ciprofloxacin Sensitivity S  Gentamicin Sensitivity S  Imipenem Sensitivity S  Levofloxacin Sensitivity S  Trimethoprim/Sulfamethoxazole Sensitivty S  Specimen Source IN/OUT CATH  Organism 1 >100,000 CFU/ML Escherichia coli  Result(s) reported on 02 Mar 2014 at 09:00AM.    17:00   Micro Text Report BLOOD CULTURE   COMMENT                   NO GROWTH IN 48 HOURS   ANTIBIOTIC                       Culture Comment NO GROWTH IN 48 HOURS  Result(s) reported on 02 Mar 2014 at 05:00PM.  Routine Chem:  05-Dec-15 04:05   Glucose, Serum 67  BUN 9  Creatinine (comp) 0.62  Sodium, Serum  146  Potassium, Serum  3.1  Chloride, Serum  115  CO2, Serum 21  Calcium (Total), Serum  8.4  Anion Gap 10  Osmolality (calc) 287  eGFR (African American) >60  eGFR (Non-African American) >60 (eGFR values <77m/min/1.73 m2 may be an indication of chronic kidney disease (CKD). Calculated eGFR, using the MRDR Study equation, is useful in  patients with stable renal function. The eGFR calculation will not be reliable in acutely ill patients when serum creatinine is changing rapidly. It is not useful in patients on dialysis. The eGFR calculation may not be applicable to patients at the low and high extremes of body sizes, pregnant women, and vegetarians.)    17:00   Result Comment TROPONIN - RESULTS VERIFIED BY  REPEAT TESTING.  - C/JESSICA CHRISTMAS AT 1804 03/01/14.PMH  - READ-BACK PROCESS PERFORMED. CK TOTAL - Slight hemolysis, interpret results with  - caution.  Result(s) reported on 01 Mar 2014 at 06:08PM.  Urine Drugs:  [id number]  Tricyclic Antidepressant, Ur Qual (comp) NEGATIVE (Result(s) reported on 28 Feb 2014 at 07:18PM.)  Amphetamines, Urine Qual. NEGATIVE  MDMA, Urine Qual. NEGATIVE  Cocaine Metabolite, Urine Qual. NEGATIVE  Opiate, Urine qual NEGATIVE  Phencyclidine, Urine Qual. NEGATIVE  Cannabinoid, Urine Qual. NEGATIVE  Barbiturates, Urine Qual. NEGATIVE  Benzodiazepine, Urine Qual. NEGATIVE (----------------- The URINE DRUG SCREEN provides only a preliminary, unconfirmed analytical test result and should not be used for non-medical  purposes.  Clinical  consideration and professional judgment should be  applied to any positive drug screen result due to possible interfering substances.  A more specific alternate chemical method must be used in order to obtain a confirmed analytical result.  Gas chromatography/mass spectrometry (GC/MS) is the preferred confirmatory method.)  Methadone, Urine Qual. NEGATIVE  Cardiac:  05-Dec-15 17:00   CK, Total  298  CPK-MB, Serum  4.9 (Result(s) reported on 01 Mar 2014 at [facility].)  Troponin I  0.27 (0.00-0.05 0.05 ng/mL or less: NEGATIVE  Repeat testing in 3-6 hrs  if clinically indicated. >0.05 ng/mL: POTENTIAL  MYOCARDIAL INJURY. Repeat  testing in 3-6 hrs if  clinically indicated. NOTE: An increase or decrease  of 30% or more on serial  testing suggests a  clinically important change)  Routine UA:  04-Dec-15 16:47   Color (UA) Yellow  Clarity (UA) Cloudy  Glucose (UA) >=500  Bilirubin (UA) Negative  Ketones (UA) Negative  Specific Gravity (UA) 1.016  Blood (UA) 2+  pH (UA) 6.0  Protein (UA) 100 mg/dL  Nitrite (UA) Negative  Leukocyte Esterase (UA) Trace (Result(s) reported on 28 Feb 2014 at 05:26PM.)  RBC (UA)  34 /HPF  WBC (UA) 61 /HPF  Bacteria (UA) 3+  Epithelial Cells (UA) NONE SEEN  Mucous (UA) PRESENT  Hyaline Cast (UA) 5 /LPF (Result(s) reported on 28 Feb 2014 at 05:26PM.)  Routine Coag:  04-Dec-15 16:18   Prothrombin 12.9  INR 1.0 (INR reference interval applies to patients on anticoagulant therapy. A single INR therapeutic range for coumarins is not optimal for all indications; however, the suggested range for most indications is 2.0 - 3.0. Exceptions to the INR Reference Range may include: Prosthetic heart valves, acute myocardial infarction, prevention of myocardial infarction, and combinations of aspirin and anticoagulant. The need for a higher or lower target INR must be assessed individually. Reference: The Pharmacology and Management of the Vitamin K  antagonists: the seventh ACCP Conference on Antithrombotic and Thrombolytic Therapy. WUJWJ.1914 Sept:126 (3suppl): N9146842. A HCT value >55% may artifactually increase the PT.  In one study,  the increase was an average of 25%. Reference:  "Effect on Routine and Special Coagulation Testing Values of Citrate Anticoagulant Adjustment in Patients with High HCT Values." American Journal of Clinical Pathology 2006;126:400-405.)  Routine Hem:  05-Dec-15 04:05   WBC (CBC)  12.5  RBC (CBC) 4.43  Hemoglobin (CBC) 12.2  Hematocrit (CBC) 36.7  Platelet Count (CBC) 191  MCV 83  MCH 27.4  MCHC 33.2  RDW  15.9  Neutrophil % 83.9  Lymphocyte % 11.1  Monocyte % 4.1  Eosinophil % 0.7  Basophil % 0.2  Neutrophil #  10.5  Lymphocyte # 1.4  Monocyte # 0.5  Eosinophil # 0.1  Basophil # 0.0 (Result(s) reported on 01 Mar 2014 at [facility].)   Radiology Results: MRI:    05-Dec-15 10:13, MRI Brain Without Contrast  MRI Brain Without Contrast   REASON FOR EXAM:    altered mental status  COMMENTS:       PROCEDURE: MR  - MR BRAIN WO CONTRAST  - Mar 01 2014 10:13AM     CLINICAL DATA:  Altered mental status with expressive  aphasia.  Initial encounter. Unknown duration of symptoms.    EXAM:  MRIHEAD WITHOUT CONTRAST    TECHNIQUE:  Multiplanar, multiecho pulse sequences of the brain and surrounding  structures were obtained without intravenous contrast.  COMPARISON:  CT head 02/28/2014.    FINDINGS:  The patient was unable to remain motionless  for the exam. Small or  subtle lesions could be overlooked. Overall the study is diagnostic.    No evidence for acute infarction, hemorrhage, mass lesion,  hydrocephalus, or extra-axial fluid. Cerebral and cerebellar atrophy  is moderately advanced. There is asymmetric CSF collection  anteriorly LEFT middle cranial fossa, likely LEFT temporal tip  atrophy. Mild subcortical and periventricular T2 and FLAIR  hyperintensities, likely chronic microvascular ischemic change.  Remote LEFT centrum semiovale lacunar infarct.    Flow voids are maintained throughout the carotid, basilar, and  vertebral arteries. There are no areas of chronic hemorrhage.  Grossly negative pituitary and cerebellar tonsils. No apparent acute  extracranial soft tissue abnormality.     IMPRESSION:  Chronic changes as described.    No definite acute abnormalities are seen on this motion degraded  examination.      Electronically Signed    By: Rolla Flatten M.D.    On: 03/01/2014 10:22     Verified By: Staci Righter, M.D.,  CT:    04-Dec-15 16:34, CT Head Without Contrast  CT Head Without Contrast   REASON FOR EXAM:    AMS  COMMENTS:       PROCEDURE: CT  - CT HEAD WITHOUT CONTRAST  - Feb 28 2014  4:34PM     CLINICAL DATA:  69 year old female with altered mental status.  Left-sided weakness. Nonverbal.    EXAM:  CT HEAD WITHOUT CONTRAST    TECHNIQUE:  Contiguous axial images were obtained from the base of the skull  through the vertex without intravenous contrast.  COMPARISON:  No priors.    FINDINGS:  Several tiny well-defined foci of low attenuation are noted  throughout  the corona radiata bilaterally, compatible with old  lacunar infarcts. No acute intracranial abnormalities. Specifically,  no evidence of acute intracranial hemorrhage, no definite findings  of acute/subacute cerebral ischemia, no mass, mass effect,  hydrocephalus or abnormal intra or extra-axial fluid collections.  Visualized paranasal sinuses and mastoids are well pneumatized. No  acute displaced skull fractures are identified.     IMPRESSION:  1. No acute intracranial abnormalities.  2. 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      Electronically Signed    By: Vinnie Langton M.D.    On: 02/28/2014 16:38         Verified By: Etheleen Mayhew, M.D.,   Radiology Impression: Radiology Impression: MRI personally reviewed by me and shows mild white matter changes, no acute infarct   Impression/Recommendations: Recommendations:   prior notes reviewed by me reviewed by me   Seizure-  this sounds to be provoked by Tramadol and UTI, no more current episodes but EEG abnormal;  these are likely not to recur Encephalopathy-  improving, multifactorial to include post-ictal, infectious, and likely medication effect d/c Tramadol and cipro increase topamax to 66m BID continue to treat UTI needs good DM control as well keep Mg > 2, Ca > 8 and Na > 130 no driving or operating heavy machinery x 6 months ok to d/c but will follow while in the hospital  Electronic Signatures: SJamison Neighbor(MD)  (Signed 07-Dec-15 17:55)  Authored: REFERRING PHYSICIAN, Primary Care Physician, Consult, History of Present Illness, Review of Systems, PAST MEDICAL/SURGICAL HISTORY, HOME MEDICATIONS, ALLERGIES, Social/Family History, NURSING VITAL SIGNS, Physical Exam-, LAB RESULTS, RADIOLOGY RESULTS, Recommendations   Last Updated: 07-Dec-15 17:55 by SJamison Neighbor(MD)

## 2014-07-19 NOTE — Consult Note (Signed)
   Present Illness 69 yo female with history of pvd s/p left aka admitted wfter being noted to be unresponsive and "foaming at the mouth" by family members. She was given D5 and narcan with minimal improvement. Her glucose was 46 at time. She was brought to the er where her brain mri did not reveal acute cva. Mental status is still poor but pt is responsive to questions but not able to give apprecialble history. SHe had a mild serum toponin elevation to 0.34. Her serum K was 3.1. EKG does not reval any significant ischemia. No chest pain per report or by patient but pt is poor historian. Bacteria in urine with negative blood cultures thus far.   Physical Exam:  GEN obese   HEENT PERRL   NECK No masses   RESP no use of accessory muscles   CARD Regular rate and rhythm  No murmur   ABD denies tenderness  normal BS   LYMPH negative neck, negative axillae   EXTR s/p left aka   SKIN normal to palpation   NEURO cranial nerves intact   PSYCH poor insight   Review of Systems:  ROS Pt not able to provide ROS   Medications/Allergies Reviewed Medications/Allergies reviewed   Family & Social History:  Family and Social History:  Family History Non-Contributory   EKG:  EKG NSR   Abnormal NSSTTW changes    Latex: Itching   Impression 69 yo female with hstory of cad s/p mi times 2 by report, pvd s/p aka who was admitted with acute alterred mental status. Etilogy is unclear. Concerned over possible uti as etiology. Mild troponin elevation. Does not appear to be an ischemic event but likley demand ischemia. She is currently stable . No arrythmia noted.No evidence of chf on cxr.   Plan 1. Emperic treatemnt of uti 2. Follow mental satus 3. Agree with holding drugs that may exacerbate encephalopthy 4. Not cnadidate for invasive or noninvasive ischemic workup at this point 5. Continue current meds 6. Further recs pending course   Electronic Signatures: Dalia HeadingFath, Kenneth A (MD)  (Signed  06-Dec-15 10:46)  Authored: General Aspect/Present Illness, History and Physical Exam, Review of System, Family & Social History, EKG , Allergies, Impression/Plan   Last Updated: 06-Dec-15 10:46 by Dalia HeadingFath, Kenneth A (MD)

## 2014-07-19 NOTE — Discharge Summary (Signed)
PATIENT NAME:  Kathleen Mcguire, Kathleen Mcguire MR#:  [id number] DATE OF BIRTH:  [birth year]  DATE OF ADMISSION:  02/28/2014 DATE OF DISCHARGE:    PRIMARY CARE PHYSICIAN:  Dr. Juel Burrow.    ADMITTING DIAGNOSES:  1.  Acute encephalopathy, multiple etiology.  2.  Elevated troponin.  3.  Peripheral vascular disease.  4.  Accelerated hypertension.   DISCHARGE DIAGNOSES:  1.  Acute encephalopathy, multifactorial, probably hypoglycemia and seizures with a component of acute cystitis.  2.  Seizures.  3.  Hypoglycemia.  4.  Acute cystitis.  5.  Dysarthria.   PROCEDURES: EEG was done on December 7 which is abnormal.   CONSULTATIONS: Neurology and cardiology.   CODE STATUS: DNR.  DISCHARGE MEDICATIONS: Losartan 100 mg p.o. daily at lunch time, Lasix 40 mg p.o. once daily, atorvastatin 40 mg p.o. at bedtime, tizanidine 4 mg 2 tablets p.o. once a day in the evening as needed, Topamax 25 mg p.o. b.i.d., amlodipine 10 mg p.o. once daily, Tylenol 325 mg 2 tablets every 4 hours as needed for mild pain, insulin Levemir 10 units subcutaneously twice a day, insulin sliding scale low dose, aspirin 81 mg once daily, metoprolol tartrate 25 mg 1/2 tablet p.o. twice a day, Bactrim DS 1 tablet p.o. 2 times a day for 3 days and the insulin sliding scale with NovoLog for Accu-Chek 0-150 no coverage, 2 units for Accu-Chek 151-200, 4 units for 201-250, 6 units for 251-300, 8 units for 301-350, 10 units for 351-400, please call and notify SNF MD for Accu-Cheks greater than 400.    BRIEF HISTORY AND PHYSICAL AND HOSPITAL COURSE:  The patient is a 70 year old female brought into the ED with altered mental status and being unresponsive. Please review history and physical dictated on 02/28/2014 by Dr. Renae Gloss for details. The patient was hypoglycemic in the ED with a blood sugar of 46. The patient was started on D5 drip. She was admitted to the hospital as she was responding, but ready lethargic.   HOSPITAL COURSE:   Acute encephalopathy,  this was assumed to be from multifactorial.    1.  Seizures.  2.  Hypoglycemia.  3.  Acute cystitis.   The acute encephalopathy was assumed to be from seizures, following which patient was in postictal state and became unresponsive, subsequently she became hypoglycemic with a thought that the patient could have overdosed herself with insulin, and a component of acute cystitis.    The patient was treated for hypoglycemia with D5 fluids.  The hypoglycemia was assumed to be from insulin overdose which is unintentional. Stroke was ruled out with negative. CAT scan of the head and MRI of the brain.   Seizures. EEG was abnormal with new onset seizures which were assumed to be provoked from tramadol and aggravated by ciprofloxacin which was given for a UTI. The patient was evaluated by neurology who has recommended to discontinue tramadol and ciprofloxacin. The patient's Topamax which is a home medication is increased from 25 mg p.o. once daily to twice a day. The patient was also evaluated for dysarthria by neurology, they recommended outpatient followup with them.    Dysarthria.  This was ruled out with negative CT head and MRI of the brain. This was assumed to be from anoxic and hypoxic brain damage during her down time when she was unresponsive. Speech pathology was consulted who has recommended to continue speech therapy. Also the patient has to use her dentures. Situation is clinically improving.   Hypoglycemia was assumed to be from insulin  overdose unintentional. The patient's diabetic medications were held during the hospital course and she was given D5 fluids. Her sugars slowly improved. According to the pharmacist and the patient's daughter she was using Novolin N 30 units twice a day and regular insulin sliding scale 40 units 3 times a day. As the patient was still hypoglycemic she was started on Levemir 10 units twice a day and she was also started on low-dose insulin sliding scale. Her Accu-Cheks  need to be monitored closely and Levemir dose needs to be adjusted further by the nursing home physician.   Acute cystitis. The patient was treated with Rocephin empirically. Urine culture has revealed Escherichia coli which is pansensitive. The patient was changed to ciprofloxacin but eventually ciprofloxacin was discontinued as it would trigger seizures and lower the threshold.  The patient was started on Bactrim DS which needs to be continued for 3 more days and the patient is will receive a total of 7 day antibiotics including Rocephin and ciprofloxacin.   Accelerated hypertension.  Blood pressure medications were adjusted. The patient is to continue taking the medications documented in the discharge medications and further fine titrating by the nursing home physician.   Elevated troponin. The patient was evaluated by cardiology, Dr. Lady Gary.  This was assumed to be from demand ischemia as the patient was unresponsive for a number of hours which are unknown.  No arrhythmias noted, no evidence of CHF. Recommended to follow up with them as an outpatient in 3-4 weeks for further followup.  PHYSICAL EXAMINATION:  GENERAL APPEARANCE: Well-nourished, well-developed, and obese, not in acute distress. HEENT: Normocephalic, atraumatic. Pupils are equally reacting to light and accommodation. No scleral icterus.  NECK: Supple. No JVD. Range of motion is intact.  LUNGS: Clear to auscultation bilaterally. No accessory muscle usage. No anterior chest wall tenderness on palpation.  CARDIOVASCULAR: S1, S2 normal. Regular rate and rhythm. No murmurs. No JVD.  GASTROINTESTINAL: Soft, obese. Bowel sounds are positive in all 4 quadrants. Nontender, nondistended. No masses felt.   NEUROLOGIC:  Awake and alert.  Follows commands.  Positive dysarthria. Cranial nerves are grossly intact.  EXTREMITIES: The patient has left AKA healed well.  No cyanosis, clubbing.  SKIN: Normal  to palpation. No rashes.  PSYCHIATRIC:  Alert, and oriented x 3. Poor insight.   CODE STATUS: Do not resuscitate.    CONDITION AT THE TIME OF DISCHARGE: Stable.   VITAL SIGNS: Temperature 98.1, pulse 72, respirations 18, blood pressure 137/72, pulse oximetry 94% on room air.   SIGNIFICANT LABORATORIES AND IMAGING STUDIES: Urine culture, Escherichia coli pansensitive to all antibiotics. Troponin 0.26, 0.42, 0.34. On December 8 BUN and creatinine are normal, sodium 141, potassium 3.2, 40 p.o. potassium was given, anion gap is 5. Serum osmolality, calcium, and magnesium are normal. CAT scan of the head without contrast performed on December 4, no acute intracranial abnormalities, multiple tiny old lacunar infarcts in the corona radiata  bilaterally.  MRI of the brain without contrast, chronic changes as described, no definite acute abnormalities are seen on this motion degraded examination. Abdominal series negative for obstruction or gross free air, no CHF or definite pneumonia. EEG was done on December 7, impression abnormal EEG with evidence of epilepsy due to R frontal sharp waves, there was intermittent generalized slowing as well.    ACTIVITY: As recommended by physical therapy as tolerated.   DIET: Low-sodium carbohydrate-controlled diet.   Discontinue tramadol and do not use ciprofloxacin as recommended by neurology. Follow up with primary care  physician in 1-2 days, neurology Dr. Katrinka BlazingSmith in a week, and cardiology in 2-4 weeks. Continue physical therapy and speech therapy. Plan of care was discussed with the patient and family members, son and daughters.  TOTAL TIME SPENT ON THE DISCHARGE: 45 minutes including discharge, coordination of care.      ____________________________ Ramonita LabAruna Kemontae Dunklee, MD ag:bu D: 03/04/2014 12:45:00 ET T: 03/04/2014 13:19:45 ET JOB#: [id number]  cc: Ramonita LabAruna Analuisa Tudor, MD, <Dictator> Ramonita LabARUNA Ilea Hilton MD ELECTRONICALLY SIGNED 03/13/2014 17:02

## 2014-07-19 NOTE — Consult Note (Signed)
CC eye pain Patient started having eye pain after being notified she was being released from hospital.  In no apparent distress on my examination. My examination elicited no tenderness or sensitivity to lights. Patient describes "glaring" eye pain in the left eye.  DM near cc20/5020/30 round reactive no apd Soft to palpation ou deep and clear.OU M&N 6:16 exam: mod NPDR, nerve sharp and pink, view clearPDR s/p PRP, nerve sharp and pink, view clear.   DM with moderate retinopathy OD, proliferative s/p PRP OSeye pain: likely dry eye. Rec artificial tears prn   - Patient is established patient at [facility]. She can follow up as outpatient.   Electronic Signatures: Dartha Lodgeandolph, Venecia Mehl D (MD)  (Signed on 28-Dec-15 18:23)  Authored  Last Updated: 28-Dec-15 18:23 by Dartha Lodgeandolph, Belynda Pagaduan D (MD)

## 2014-07-19 NOTE — H&P (Signed)
PATIENT NAME:  Kathleen Mcguire, Kathleen Mcguire MR#:  [id number] DATE OF BIRTH:  10-Jun-1945  DATE OF ADMISSION:  03/21/2014  REFERRING PHYSICIAN: [place] SinkJade J. Dolores FrameSung, MD  PRIMARY CARE PHYSICIAN: Corky DownsJaved Masoud, MD  ADMITTING PHYSICIAN: Crissie FiguresEdavally N. Thailan Sava, MD  CHIEF COMPLAINT: Unresponsiveness.   HISTORY OF PRESENT ILLNESS: A 69 year old Caucasian female with a past medical history significant for diabetes mellitus type 2, hypertension, peripheral vascular disease, status post left above-knee amputation, hyperlipidemia, fibromyalgia, chronically lumbago, history of recent admission with hypoglycemia and encephalopathy, was brought by EMS with a history of found unresponsive at home with a low blood sugar of 28. According to the EMS note and the ED physician's  note, the patient took 30 units of regular insulin last afternoon and after that she had half a cheese sandwich and became unresponsive subsequently. EMS was called upon because of responsiveness and they found the patient was unresponsive and the blood sugar check on Accu-Chek was 28. Hence, she was given 1 ampule of D50 IV, following which she regained consciousness and her blood sugars improved to 166. She was brought to the Emergency Room for further evaluation. In the Emergency Room, the patient was evaluated by the ED physician and found to be alert, awake, and oriented, but continues to have low blood sugars in the range of 30s to 40s. Hence, she was started on D10 IV drip and being monitored closely. The patient is alert, awake, and oriented x 3 at this time and denies any complaints at this time. As mentioned earlier, the patient was recently admitted in the first week of December of this year with encephalopathy secondary to severe hypoglycemia and she was discharged on reduced doses of sliding scale insulin, but she continues to take 30 units of regular insulin at home 3 times a day. Denies any chest pain, shortness of breath. No fever, no cough, no nausea, no  vomiting, no diarrhea, no abdominal pain. No dysuria, frequency, or urgency.   PAST MEDICAL HISTORY:  1.  Diabetes mellitus type 2.  2.  Hypertension.  3.  Peripheral vascular disease, status post left above-knee amputation.  4.  Hyperlipidemia.  5.  Fibromyalgia.  6.  Chronic lumbago.  7.  Recent admission with encephalopathy secondary to hypoglycemia.    PAST SURGICAL HISTORY:  1.  Amputation of toes.  2.  Amputation of the left leg.   ALLERGIES: LATEX.   HOME MEDICATIONS:  1.  Acetaminophen 325 mg tablet 2 tablets orally every 4 hours as needed.  2.  Amlodipine 10 mg tablet 1 tablet orally once a day.  3.  Aspirin 81 mg tablet 1 tablet orally once a day.  4.  Atorvastatin 40 mg tablet 1 tablet orally once a day.  5.  Insulin aspart subcutaneous as sliding scale.  6.  Lasix 40 mg tablet 1 tablet orally once a day.  7.  Losartan 100 mg tablet 1 tablet orally once a day.  8.  Metoprolol 25 mg tablet half tablet orally twice a day.  9.  Tizanidine 4 mg tablet 2 tablets orally once a day in the evening as needed.  10.  Topiramate 25 mg tablet 1 tablet orally 2 times a day.   SOCIAL HISTORY: She lives at home and husband lives with her. She has history of smoking in the past. Did smoke for about 30 years and quit in the 1990s. No history of alcohol or substance abuse. 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   FAMILY HISTORY: Father  died of congestive heart failure. He also had diabetes. Mother had dementia.   REVIEW OF SYSTEMS:   CONSTITUTIONAL: Negative for fever, fatigue, or generalized weakness.  EYES: Negative for blurred vision, double vision. No pain. No redness or inflammation.  EARS, NOSE, AND THROAT: Negative for tinnitus, ear pain, hearing loss, epistaxis, nasal discharge, or difficulty swallowing.   RESPIRATORY: Negative for cough, wheezing, hemoptysis, painful respirations, or dyspnea.  CARDIOVASCULAR: Negative for chest pain, orthopnea, pedal edema, dyspnea on  exertion, palpitations, or syncope. GASTROINTESTINAL: Negative for nausea, vomiting, diarrhea, abdominal pain, hematemesis, melena, or GERD symptoms.  GENITOURINARY: Negative for dysuria, hematuria, frequency, or urgency.  ENDOCRINE: Negative for polyuria. No heat or cold intolerance.  HEMATOLOGIC: Negative for anemia, easy bruising, bleeding, or swollen glands.  INTEGUMENTARY: Negative for acne or skin lesions.  MUSCULOSKELETAL: History of fibromyalgia; takes p.r.n. pain medications.  NEUROLOGIC: History of seizure disorder, takes topiramate. No history of CVA or TIA.  PSYCHIATRIC: Negative for anxiety, insomnia, or depression.   PHYSICAL EXAMINATION:  VITAL SIGNS: Temperature 97.4; pulse rate 64 per minute, regular; respirations 14 per minute, blood pressure 132/46, O2 saturation is 99% on room air.  GENERAL: Well-nourished, well-developed, obese lady, alert, awake, and oriented x 3, in no acute distress, comfortably lying in the bed.  HEAD: Atraumatic, normocephalic.  EYES: Pupils are equal, reacting to light and accommodation. No conjunctival pallor. No scleral icterus. Extraocular movements intact.  NOSE: No nasal lesions. No drainage.  EARS: No drainage. No external lesions.  ORAL CAVITY: No mucosal lesions. No exudates.  NECK: Supple. No JVD. No thyromegaly. No carotid bruit. Range of motion of neck normal. .  RESPIRATORY: Good respiratory effort. Bilateral vesicular breath sounds. Not using accessory muscles of respiration. No rales or rhonchi.  CARDIOVASCULAR: S1, S2 regular. No murmurs appreciated. Peripheral pulses good at right lower extremity. Left lower extremity is status post above-knee amputation.  GASTROINTESTINAL: Abdomen is soft, obese, nontender. No hepatosplenomegaly. Bowel sounds are present. No rebound. No guarding. No rigidity.  MUSCULOSKELETAL: Range of motion normal. Strength and tone normal.  SKIN: Inspection is within normal limits.  LYMPHATIC: No cervical  lymphadenopathy.  VASCULAR: Good dorsalis pedis and posterior tibial pulses on the right lower extremity.  NEUROLOGICAL: Alert, awake, and oriented x 3. Cranial nerves II to XII grossly intact. Deep tendon reflexes 2+ on the right lower extremity. Motor strength 5/5 in the right lower extremity and to both upper extremities.  PSYCHIATRIC: Judgment and insight are adequate. Alert and oriented x 3. Memory and mood within normal limits.   LABORATORY DATA: Serum glucose 37, BUN 32, creatinine 0.97, sodium 142, potassium 3.6, chloride 107, bicarbonate 24, serum calcium 8.9, total protein 7.2, albumin 3.6, total bilirubin 0.3, alkaline phosphatase 58, AST 22, ALT 20. Troponin less than 0.02. CBC: WBC 9.7, hemoglobin 13.5, hematocrit 41.2, platelet count 196,000. Urinalysis: WBCs 25, cloudy.   IMAGING STUDIES: Chest x-ray: Low inspiratory examination, mild cardiomegaly, and pulmonary vascular congestion.   EKG: Normal sinus rhythm with ventricular rate of 67 beats per minute, left axis deviation. No acute ST-T changes.   ASSESSMENT AND PLAN: A 69 year old Caucasian female with a past medical history of hypertension, diabetes mellitus type 2, peripheral vascular disease, hyperlipidemia, fibromyalgia,  chronic lumbago, recent admission with hypoglycemia and encephalopathy, presents to the Emergency Room via emergency medical service with complaints of unresponsiveness secondary to hypoglycemia following taking 30 units of regular insulin. The patient received D50 1 ampule intravenous by emergency medical service following which she regained her consciousness and  blood sugars improved.  1.  Acute encephalopathy secondary to severe hypoglycemia following taking regular insulin and inadequate oral intake. Resolved after receiving D50 1 ampule intravenous. The patient is stable currently and alert, awake, and oriented x 3.  2.  Hypoglycemia, which is recurrent secondary to excessive insulin administration and  associated inadequate food intake. Plan: Admit to medical surgery for observation. We will hold off scheduled insulin for now and we will give D50 intravenous and monitor Accu-Cheks closely.  3.  Abnormal urinalysis, rule out urinary tract infection. Plan: Check urine culture and sensitivity and followup accordingly.  4.  Diabetes mellitus type 2. We hold off home insulin for now because of hypoglycemia. We will monitor blood sugars closely and sliding scale insulin for now.  5.  Hypertension, stable on home medications. Continue same.  6.  Hyperlipidemia, on statin. Continue same.  7.  Fibromyalgia, on home medications, stable. Continue same.  8.  Deep vein thrombosis prophylaxis with subcutaneous Lovenox.  9.  Gastrointestinal prophylaxis with Protonix.   CODE STATUS: The patient is DO NOT RESUSCITATE.   TIME SPENT: 55 minutes.     ____________________________ Crissie Figures, MD enr:ts D: 03/22/2014 03:53:36 ET T: 03/22/2014 04:25:40 ET JOB#: [id number]  cc: Crissie Figures, MD, <Dictator> Corky Downs, MD Crissie Figures MD ELECTRONICALLY SIGNED 03/23/2014 20:32

## 2014-07-19 NOTE — Discharge Summary (Signed)
Dates of Admission and Diagnosis:  Date of Admission 22-Mar-2014   Date of Discharge 24-Mar-2014   Admitting Diagnosis Encephalopathy due to Hypoglycemia- due to high dose insulin and not eating enough   Final Diagnosis Encephalopathy due to Hypoglycemia- due to high dose insulin and not eating enough Urinary tract infection Diabetes Hypertension Hyperlipidemia. jaw pain    Chief Complaint/History of Present Illness A 69 year old Caucasian female with a past medical history significant for diabetes mellitus type 2, hypertension, peripheral vascular disease, status post left above-knee amputation, hyperlipidemia, fibromyalgia, chronically lumbago, history of recent admission with hypoglycemia and encephalopathy, was brought by EMS with a history of found unresponsive at home with a low blood sugar of 28. According to the EMS note and the ED physician???s  note, the patient took 30 units of regular insulin last afternoon and after that she had half a cheese sandwich and became unresponsive subsequently. EMS was called upon because of responsiveness and they found the patient was unresponsive and the blood sugar check on Accu-Chek was 28. Hence, she was given 1 ampule of D50 IV, following which she regained consciousness and her blood sugars improved to 166. She was brought to the Emergency Room for further evaluation. In the Emergency Room, the patient was evaluated by the ED physician and found to be alert, awake, and oriented, but continues to have low blood sugars in the range of 30s to 40s. Hence, she was started on D10 IV drip and being monitored closely. The patient is alert, awake, and oriented x 3 at this time and denies any complaints at this time. As mentioned earlier, the patient was recently admitted in the first week of December of this year with encephalopathy secondary to severe hypoglycemia and she was discharged on reduced doses of sliding scale insulin, but she continues to take 30  units of regular insulin at home 3 times a day. Denies any chest pain, shortness of breath. No fever, no cough, no nausea, no vomiting, no diarrhea, no abdominal pain. No dysuria, frequency, or urgency.   Allergies:  Latex: Itching  Pertinent Past History:  Pertinent Past History 1.  Diabetes mellitus type 2.  2.  Hypertension.  3.  Peripheral vascular disease, status post left above-knee amputation.  4.  Hyperlipidemia.  5.  Fibromyalgia.  6.  Chronic lumbago.  7.  Recent admission with encephalopathy secondary to hypoglycemia.   Hospital Course:  Hospital Course 1.  Acute encephalopathy secondary to severe hypoglycemia following taking regular insulin and inadequate oral intake.   Resolved after receiving D50 1 ampule intravenous. The patient is stable currently and alert, awake, and oriented x 3.    on D5 drip, Blood sugar stable. stopped D5 now, started low dose aspart insulin.    Explained about lower requirement of insulin , and regularity in her diet.  aded 10 units detemir insulin for better control.  2.  Hypoglycemia, which is recurrent secondary to excessive insulin administration and associated inadequate food intake.      due to jaw pain, Called ENT consult to help with that.    asper him- pain med and soft diet for now. No fracture on Xray.  3.  Abnormal urinalysis+ frequency - UTI , cx shows not specific- cont levaquin. 4.  Diabetes mellitus type 2. hold off home insulin for now because of hypoglycemia. We will monitor blood sugars closely , started on low dose insulins. 5.  Hypertension, stable on home medications. Continue same.  6.  Hyperlipidemia, on statin. Continue  same.  7.  Fibromyalgia, on home medications, stable. Continue same. d/c to rehab today.   Condition on Discharge Stable   DISCHARGE INSTRUCTIONS HOME MEDS:  Medication Reconciliation: Patient's Home Medications at Discharge:     Medication Instructions  losartan 100 mg oral tablet  1 tab(s)  orally once a day at lunch   lasix 40 mg oral tablet  1 tab(s) orally once a day   atorvastatin 40 mg oral tablet  1 tab(s) orally once a day (at bedtime)   aspirin 81 mg oral tablet, chewable  1 tab(s) orally once a day   amlodipine 10 mg oral tablet  1 tab(s) orally once a day   tizanidine 4 mg oral tablet  2 tab(s) orally once a day (in the evening), As Needed   metoprolol tartrate 25 mg oral tablet  0.5 tab(s) orally 2 times a day   topiramate 25 mg oral tablet  1 tab(s) orally 2 times a day   insulin aspart 100 units/ml subcutaneous solution  6 unit(s) subcutaneous 3 times a day (before meals)   insulin detemir 100 units/ml subcutaneous solution  10 unit(s) subcutaneous once a day   ibuprofen 400 mg oral tablet  1 tab(s) orally every 6 to 8 hours, As Needed, jaw pain. , As needed, jaw pain.   levofloxacin 500 mg oral tablet  1 tab(s) orally once a day x 3 days   glucerna shake *  237 milliliter(s) orally 3 times a day (with meals)    STOP TAKING THE FOLLOWING MEDICATION(S):    acetaminophen 325 mg oral tablet: 2 tab(s) orally every 4 hours, As needed, mild pain (1-3/10) or temp. greater than 100.4  Physician's Instructions:  Diet Low Sodium  Low Fat, Low Cholesterol  Carbohydrate Controlled (ADA) Diet   Dietary Supplements Glucerna   Dietary Supplements Frequency Three times per day   Activity Limitations As tolerated   Return to Work Not Applicable   Time frame for Follow Up Appointment 1-2 weeks  PMD   Other Comments If jaw pain continues after 1-2 weeks, may need to see ENT or TM joint specialist- to be decided by PMD.     Vernie Murders H(Consultant): [facility], [address], [place], [place] [address], [place] [phone]  TIME SPENT:  Total Time: Greater than 30 minutes   Electronic Signatures: Altamese Dilling (MD)  (Signed 28-Dec-15 13:41)  Authored: ADMISSION DATE AND DIAGNOSIS, CHIEF COMPLAINT/HPI, Allergies, PERTINENT  PAST HISTORY, HOSPITAL COURSE, DISCHARGE INSTRUCTIONS HOME MEDS, PATIENT INSTRUCTIONS, Follow Up Physician, TIME SPENT   Last Updated: 28-Dec-15 13:41 by Altamese Dilling (MD)

## 2014-07-19 NOTE — H&P (Signed)
PATIENT NAME:  Kathleen Mcguire, Kathleen Mcguire MR#:  [id number] DATE OF BIRTH:  Sep 18, 1945  DATE OF ADMISSION:  02/28/2014  PRIMARY CARE PHYSICIAN: Unknown. Looking back at old record, it could be Dr. Juel Burrow.  CHIEF COMPLAINT: Brought in with altered mental status, unresponsive.   HISTORY OF PRESENT ILLNESS: This is a 69 year old female with diabetes, chronic pain, peripheral vascular disease, hypertension. Son got home from work and noticed the patient unresponsive, foam out of her mouth. The last time he saw her normal was 8:00 p.m. last night. This morning when he left for work, she was sleeping. Normally the patient makes coffee, but the patient did not even get out of bed to make coffee, probably in bed all day. For about a week, she has been complaining about some urinary symptoms. In the ER, she was given a dose of Narcan, started on a D5 drip for a sugar of 46. The patient able to answer some yes or no questions and move her extremities, but unable to give any history. History obtained from son and old chart.   PAST MEDICAL HISTORY: Diabetes, hypertension, peripheral vascular disease, hyperlipidemia, fibromyalgia, chronic lumbago.   PAST SURGICAL HISTORY: Amputation of toes and then amputation of the left leg.   ALLERGIES: LATEX.   MEDICATIONS: As per prescription rider include amlodipine 2.5 mg 2 tablets daily, atorvastatin 40 mg at bedtime, Lasix 40 mg daily, losartan 100 mg daily, tizanidine 4 mg 2 tablets once a day in the evening, Topamax 25 mg at bedtime, tramadol 50 mg half tablet every 6 hours as needed. Family states that she does take a diabetic injection which I will have the pharmacy tech confirm.   SOCIAL HISTORY: Quit smoking in the 1990s, did smoke for about 30 years. No alcohol. No drug use. 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   FAMILY HISTORY: Father died of congestive heart failure, also had diabetes. Mother had dementia.   REVIEW OF SYSTEMS: Difficult to obtain secondary to altered  mental status. Answered no to all questions and all review of systems.   GENERAL: No fever. No weakness. No sweats.   EYES: No blurry vision. Ears, nose, mouth and throat: No hearing loss. No sore throat. No difficulty swallowing.   CARDIOVASCULAR: No chest pain. No palpitations.   RESPIRATORY: No shortness of breath. No cough.   GASTROINTESTINAL: No nausea. No vomiting. No abdominal pain. No diarrhea. No constipation.   GENITOURINARY: No burning on urination or hematuria.   MUSCULOSKELETAL: No joint pain or muscle pain.   INTEGUMENT: No rashes or eruptions.   NEUROLOGIC: No fainting.   PSYCHIATRIC: No anxiety.   ENDOCRINE: No thyroid problems.   HEMATOLOGIC: No anemia.   PHYSICAL EXAMINATION: VITAL SIGNS: On presentation to the hospital included a pulse of 79, respirations 22, blood pressure 217/80, pulse oximetry 98% on 2 liters. I do not see a temperature documented at all.   GENERAL: No respiratory distress, lying flat in bed.   EYES: Conjunctivae and lids normal. Pupils equal, round and reactive to light. Extraocular muscles intact. No nystagmus.   EARS, NOSE, MOUTH AND THROAT: Tympanic membranes: No erythema. Nasal mucosa: No erythema. Throat: No erythema. No exudate seen. Lips and gums: No lesions.   NECK: No JVD. No bruits. No lymphadenopathy. No thyromegaly. No thyroid nodules palpated.   LUNGS: Clear to auscultation. No use of accessory muscles to breathe. No rhonchi, rales or wheeze heard.   CARDIOVASCULAR: S1, S2 normal; 2/6 systolic ejection murmur. Carotid upstroke 2+ bilaterally. No  bruits.   EXTREMITIES: Dorsalis pedis pulses 2+ bilaterally, 2+ edema right lower extremity.   ABDOMEN: Soft, nontender. No organomegaly/splenomegaly. Normoactive bowel sounds. No masses felt.   LYMPHATIC: No lymph nodes in the neck.   MUSCULOSKELETAL: 2+ edema. No clubbing. No cyanosis.   SKIN: Chronic lower extremity discoloration and scaling of the right lower extremity.    NEUROLOGIC: The patient is slow to respond to questions and answers all yes or no questions no. The patient is able to move her arms and lift right leg up off the bed. Cranial nerves 2 through 12 grossly intact. Deep tendon reflexes 1+ right lower extremity.   PSYCHIATRIC: Difficult to test secondary to and answering all questions no.   LABORATORY AND RADIOLOGICAL DATA: URINALYSIS: 2+ blood, greater than 500 mg/dL of glucose. Leukocyte esterase, trace. Nitrites negative, 3+ bacteria. 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 No acute intracranial abnormalities. Troponin borderline at 0.26. Glucose initially on chemistry 88, BUN 15, creatinine 0.64, sodium 142, potassium 3.4, chloride 109, CO2 of 23, calcium 8.6. Liver function tests normal range. White blood cell count 13.7, hemoglobin and hematocrit 13.7 and 42.3, platelet count 184,000. INR 1.0. Glucose after the initial chemistry was 46.   ASSESSMENT AND PLAN: 1.  Acute encephalopathy, could be multiple causes. This could be hypoglycemia with her history of diabetes. We did have a low sugar of 46 here. I will have the pharmacy check to see what type of medications she is on for diabetes. We will put her on D5 drip at this point and continue to monitor. This also could be a cerebrovascular accident. We will give aspirin and get an MRI of the brain. This could also be an infection, cystitis. We will give Rocephin, get a urine culture and blood cultures. This could be too much pain medications and muscle relaxants. We will hold all psychiatric medications, pain medications and muscle relaxants at this time and continue to monitor. Could also be potentially a seizure. I will get an EEG also in the morning but seizure less likely. Will watch on telemetry at this point.  2. Elevated troponin, likely secondary to the altered mental status and foaming at the mouth. We will  continue to monitor on telemetry, get serial cardiac enzymes, continue aspirin.  3. Peripheral vascular disease on aspirin.  4. Accelerated hypertension. We will continue her usual medications and continue to monitor. Would not want her blood pressure too low at this point since a stroke is still in the differential.   TIME SPENT ON ADMISSION: 55 minutes.   The patient is a DNR.     ____________________________ Herschell Dimesichard J. Renae GlossWieting, MD rjw:TT D: 02/28/2014 18:25:38 ET T: 02/28/2014 19:15:56 ET JOB#: [id number]  cc: Herschell Dimesichard J. Renae GlossWieting, MD, <Dictator> Corky DownsJaved Masoud, MD Salley ScarletICHARD J Cortnie Ringel MD ELECTRONICALLY SIGNED 03/08/2014 12:31

## 2014-07-23 NOTE — Discharge Summary (Signed)
PATIENT NAME:  Kathleen Mcguire, Kathleen Mcguire MR#:  [id number] DATE OF BIRTH:  Oct 10, 1945  DATE OF ADMISSION:  03/22/2014 DATE OF DISCHARGE:  03/25/2014  ADDENDUM  This is an addendum to the discharge summary done yesterday by me. We were waiting on insurance approval for the patient's discharge and so she could not go yesterday. Now today she is ready to be discharged and will be going to a rehab center. For further details, please see earlier discharge summary done yesterday for the patient.  ____________________________ Hope PigeonVaibhavkumar G. Elisabeth PigeonVachhani, MD vgv:sb D: 03/25/2014 10:53:02 ET T: 03/25/2014 10:56:50 ET JOB#: [id number]  cc: Hope PigeonVaibhavkumar G. Elisabeth PigeonVachhani, MD, <Dictator> Altamese DillingVAIBHAVKUMAR Nera Haworth MD ELECTRONICALLY SIGNED 04/02/2014 0:41

## 2014-07-23 NOTE — Consult Note (Signed)
PATIENT NAME:  Kathleen Mcguire, Kathleen Mcguire MR#:  [id number] DATE OF BIRTH:  Aug 29, 1945  DATE OF CONSULTATION:  03/23/2014  REFERRING PHYSICIAN:   CONSULTING PHYSICIAN:  Cammy Copa, MD  PRIMARY CARE PHYSICIAN:  REQUESTING PHYSICIAN: Dr. Heath Gold.  REASON FOR CONSULTATION: Bilateral jaw pain affecting the ability to eat.   HISTORY OF PRESENT ILLNESS: The patient is a 69 year old white female who has had long history of diabetes mellitus. She has been on high-dose insulin and approximately a month ago, had a low blood sugar and possible seizure. Since that time, she has complained of pain in both of her jaw joints. She can get her mouth open, but it hurts to chew and so she had not been eating very much and, therefore, her blood sugars have been dropping because she continues to take the same amount of insulin. She was admitted the hospital 2 days ago for further evaluation and controlling her blood sugar better. Consultation was made to evaluate her jaw joints to see if there is any evidence of dislocation or fracture or anything that we can do to help resolve this.   PAST MEDICAL HISTORY: Significant for diabetes mellitus type 2 insulin-dependent, hypertension, peripheral vascular disease status post left AKA, hyperlipidemia, fibromyalgia, chronic lumbago, recent admission with encephalopathy secondary to hypoglycemia.   PAST SURGICAL HISTORY: Amputation of her toes and amputation of the left leg.  CURRENT MEDICATIONS: I reviewed these in the chart.   SOCIAL HISTORY: She lives at home with her husband. She has smoked in the past and smoked for 30 years, but quit in the 1990s. No history of alcohol or sepsis abuse. 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   FAMILY HISTORY: Father died of congestive heart failure and diabetes. Mom had dementia.   PHYSICAL EXAMINATION: HEENT: The ear canals look relatively clear, no sign of infection. The nose is open anteriorly. The oropharynx shows upper and  lower dentures. No oral lesions. She has good jaw mobility, although she is tender over both the TMJs. There is no deviation of her jaw with movement and no sign of oropharyngeal lesions. No redness or evidence for infection. NECK: Negative for any nodes or masses. There is no bruising and her jaw joints are swelling around the TMJs or mandible.   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 The most important things currently are jaw rest, heat, and anti-inflammatory medications to help control this. She will be best not trying to chew on sandwiches or anything that is hard or very chewy. She would do better with blenderized food or food that she does not have to chew much at all. She knows the importance of not clenching or not opening her mouth very wide to rest her joints. She can use heating pads over temporomandibular joint on both sides to help and she needs anti-inflammatory medications to help settle this down. We will obtain regular jaw films just to make sure there is no evidence of cracks or fractures in the condyle on either side, although by palpation it does not feel like there are any signs of mandibular fracture. If the patient does not improve when she is discharged, then seeing oral surgeon who has a specific interest in temporomandibular joint problems would be best and possible MRI of her TMJs can be done to see if there is any derangement of the joint or ligaments of the temporomandibular joint.     ____________________________ Cammy Copa,  MD phj:sw D: 03/23/2014 16:51:51 ET T: 03/23/2014 17:13:11 ET JOB#: [id number]  cc: Cammy CopaPaul H. Egan Sahlin, MD, <Dictator> Cammy CopaPAUL H Esdras Delair MD ELECTRONICALLY SIGNED 04/14/2014 8:52

## 2014-07-29 ENCOUNTER — Encounter: Payer: Self-pay | Admitting: Speech Pathology

## 2014-07-29 ENCOUNTER — Ambulatory Visit: Payer: Medicare Other | Attending: Internal Medicine | Admitting: Speech Pathology

## 2014-07-29 DIAGNOSIS — R569 Unspecified convulsions: Secondary | ICD-10-CM | POA: Insufficient documentation

## 2014-07-29 DIAGNOSIS — G9340 Encephalopathy, unspecified: Secondary | ICD-10-CM | POA: Insufficient documentation

## 2014-07-29 DIAGNOSIS — R4701 Aphasia: Secondary | ICD-10-CM | POA: Insufficient documentation

## 2014-07-29 NOTE — Patient Instructions (Signed)
High level word finding worksheets given to pt to complete for homework.

## 2014-07-29 NOTE — Therapy (Signed)
[place] [facility] MAIN [facility] SERVICES [address] [place], [place], [address] Phone: [phone]   Fax:  [phone]  Speech Language Pathology Treatment  Patient Details  Name: Kathleen Mcguire MRN: [id number] Date of Birth: Dec 31, 1945 Referring Provider:  Corky Downs, MD  Encounter Date: 07/29/2014      End of Session - 07/29/14 1447    Visit Number 3   Number of Visits 16   Date for SLP Re-Evaluation 08/26/14   Authorization Type 2 visits since last G-code assigned on 07/01/14   Authorization Time Period 2 of 10 visits   SLP Start Time 1300   SLP Stop Time  1358   SLP Time Calculation (min) 58 min   Activity Tolerance Patient tolerated treatment well;Other (comment)  Pt often stated, "I can't do this, I am stuck."      Past Medical History  Diagnosis Date  . Allergy   . Seizures   . Stroke   . Hypertension   . Depression     Past Surgical History  Procedure Laterality Date  . Abdominal hysterectomy      There were no vitals filed for this visit.  Visit Diagnosis: Aphasia      Subjective Assessment - 07/29/14 1429    Subjective Pt reports she "can't" get the words to come up sometimes.   Currently in Pain? No/denies           SLP Evaluation [facility] - 07/29/14 1429    SLP Visit Information   SLP Received On 07/29/14   Onset Date 02/28/2014   Medical Diagnosis Seizures/enchephalopathy   Subjective   Subjective Pt reports she "can't" get the words to come up sometimes.   General Information   Mobility Status Uses scooter   Prior Functional Status   Cognitive/Linguistic Baseline Within functional limits   Type of Home House    Lives With Son   Available Support Family   Education College degree   Vocation Retired  Engineer, civil (consulting)            ADULT SLP TREATMENT - 07/29/14 0001    General Information   Behavior/Cognition Alert;Cooperative   Patient Positioning Upright in chair   Oral care provided N/A   HPI Pt is 3  months post encephalopathy and seizure acitivity, presenting with mild expressive aphasia   Treatment Provided   Treatment provided Cognitive-Linquistic   Pain Assessment   Pain Assessment No/denies pain   Cognitive-Linquistic Treatment   Treatment focused on Aphasia   Skilled Treatment Patient completed a variety of moderate word finding worksheets. Pt stated synonyms with 58% acc improving to 79% with min verbal cues. Stated antonyms with 95% acc independently. Stated two different meanings given a homonym with 92% acc with mod cues. Stated ten words that can be associated with a given word, 3/3 words with moderate cues.  Minimal difficulty with antonyms and was able to grossly describe two different meanings of a homonym. Pt demonstrated increased difficulty with highly specific adjectives or words and synonyms. Pt required cues to be less vague during tasks.    Assessment / Recommendations / Plan   Plan Continue with current plan of care          SLP Education - 07/29/14 1446    Education provided Yes   Education Details Plan of care and treatment goals   Person(s) Educated Patient   Methods Explanation   Comprehension Verbalized understanding            SLP  Long Term Goals - 07/29/14 1500    SLP LONG TERM GOAL #1   Title Patient will generate grammatical and cogent sentence given cognitive-linguistic task with 80% accuracy and min SLP cues to be achieved by 08/26/2014.   Time 6   Period Weeks   Status New   SLP LONG TERM GOAL #2   Title Patient will complete high level word finding tasks with 80% accuracy and min SLP cues to be achieved by 08/26/2014.   Time 6   Period Weeks   Status New          Plan - 07/29/14 1452    Clinical Impression Statement Pt communicates well in conversation with ST during tx, however demonstrates difficulty in word finding tasks with higher specificity. During treatment pt demonstrated minimal difficulty with antonyms and was able to  grossly describe two different meanings of a homonym. However, pt demonstrated increased difficulty with highly specific adjectives and synonyms and required cues to be less vague during tasks. Pt is demonstrating reduction in specificity and flexibility of language and we will continue ST  to address these needs.    Speech Therapy Frequency 2x / week   Duration Other (comment)  6 weeks   Treatment/Interventions Language facilitation;Functional tasks;SLP instruction and feedback;Patient/family education   Potential to Achieve Goals Good   SLP Home Exercise Plan Word finding worksheets to be completed daily as able   Consulted and Agree with Plan of Care Patient          G-Codes - 07/29/14 1505    Functional Limitations Spoken language expressive   Spoken Language Expression Current Status [phone]) At least 20 percent but less than 40 percent impaired, limited or restricted   Spoken Language Expression Goal Status ([id number]) At least 1 percent but less than 20 percent impaired, limited or restricted      Problem List Patient Active Problem List   Diagnosis Date Noted  . Seizures 07/29/2014  . Encephalopathy 07/29/2014   Lauree ChandlerMaaran [place], MA, CCC-SLP  Speech-Language Pathologist  [place],Maaran 07/29/2014, 3:08 PM  [facility] [address] [place], [place], [id number] Phone: [phone]   Fax:  [phone]

## 2014-08-05 ENCOUNTER — Ambulatory Visit: Payer: Self-pay | Admitting: Speech Pathology

## 2014-08-13 ENCOUNTER — Ambulatory Visit: Payer: Self-pay | Admitting: Speech Pathology

## 2014-08-13 DIAGNOSIS — M47812 Spondylosis without myelopathy or radiculopathy, cervical region: Secondary | ICD-10-CM | POA: Diagnosis not present

## 2014-08-13 DIAGNOSIS — G894 Chronic pain syndrome: Secondary | ICD-10-CM | POA: Diagnosis not present

## 2014-08-13 DIAGNOSIS — M961 Postlaminectomy syndrome, not elsewhere classified: Secondary | ICD-10-CM | POA: Diagnosis not present

## 2014-08-15 DIAGNOSIS — Z7982 Long term (current) use of aspirin: Secondary | ICD-10-CM | POA: Diagnosis not present

## 2014-08-15 DIAGNOSIS — R4701 Aphasia: Secondary | ICD-10-CM | POA: Diagnosis not present

## 2014-08-15 DIAGNOSIS — R809 Proteinuria, unspecified: Secondary | ICD-10-CM | POA: Diagnosis not present

## 2014-08-15 DIAGNOSIS — M797 Fibromyalgia: Secondary | ICD-10-CM | POA: Diagnosis not present

## 2014-08-15 DIAGNOSIS — E782 Mixed hyperlipidemia: Secondary | ICD-10-CM | POA: Diagnosis not present

## 2014-08-15 DIAGNOSIS — E11359 Type 2 diabetes mellitus with proliferative diabetic retinopathy without macular edema: Secondary | ICD-10-CM | POA: Diagnosis not present

## 2014-08-15 DIAGNOSIS — Z993 Dependence on wheelchair: Secondary | ICD-10-CM | POA: Diagnosis not present

## 2014-08-15 DIAGNOSIS — G894 Chronic pain syndrome: Secondary | ICD-10-CM | POA: Diagnosis not present

## 2014-08-15 DIAGNOSIS — Z89419 Acquired absence of unspecified great toe: Secondary | ICD-10-CM | POA: Diagnosis not present

## 2014-08-15 DIAGNOSIS — E11649 Type 2 diabetes mellitus with hypoglycemia without coma: Secondary | ICD-10-CM | POA: Diagnosis not present

## 2014-08-15 DIAGNOSIS — E118 Type 2 diabetes mellitus with unspecified complications: Secondary | ICD-10-CM | POA: Diagnosis not present

## 2014-08-15 DIAGNOSIS — G629 Polyneuropathy, unspecified: Secondary | ICD-10-CM | POA: Diagnosis not present

## 2014-08-15 DIAGNOSIS — M961 Postlaminectomy syndrome, not elsewhere classified: Secondary | ICD-10-CM | POA: Diagnosis not present

## 2014-08-15 DIAGNOSIS — I2510 Atherosclerotic heart disease of native coronary artery without angina pectoris: Secondary | ICD-10-CM | POA: Diagnosis not present

## 2014-08-15 DIAGNOSIS — Z833 Family history of diabetes mellitus: Secondary | ICD-10-CM | POA: Diagnosis not present

## 2014-08-15 DIAGNOSIS — Z794 Long term (current) use of insulin: Secondary | ICD-10-CM | POA: Diagnosis not present

## 2014-08-15 DIAGNOSIS — Z791 Long term (current) use of non-steroidal anti-inflammatories (NSAID): Secondary | ICD-10-CM | POA: Diagnosis not present

## 2014-08-15 DIAGNOSIS — Z90710 Acquired absence of both cervix and uterus: Secondary | ICD-10-CM | POA: Diagnosis not present

## 2014-08-15 DIAGNOSIS — Z87891 Personal history of nicotine dependence: Secondary | ICD-10-CM | POA: Diagnosis not present

## 2014-08-15 DIAGNOSIS — I10 Essential (primary) hypertension: Secondary | ICD-10-CM | POA: Diagnosis not present

## 2014-08-15 DIAGNOSIS — Z79899 Other long term (current) drug therapy: Secondary | ICD-10-CM | POA: Diagnosis not present

## 2014-08-15 DIAGNOSIS — I739 Peripheral vascular disease, unspecified: Secondary | ICD-10-CM | POA: Diagnosis not present

## 2014-08-15 DIAGNOSIS — Z818 Family history of other mental and behavioral disorders: Secondary | ICD-10-CM | POA: Diagnosis not present

## 2014-08-15 DIAGNOSIS — R609 Edema, unspecified: Secondary | ICD-10-CM | POA: Diagnosis not present

## 2014-08-15 LAB — MICROALBUMIN, URINE: Microalb, Ur: 182.6

## 2014-08-20 ENCOUNTER — Ambulatory Visit: Payer: Medicare Other | Admitting: Speech Pathology

## 2014-08-20 ENCOUNTER — Ambulatory Visit: Payer: Medicare Other | Attending: Neurology | Admitting: Speech Pathology

## 2014-08-20 ENCOUNTER — Encounter: Payer: Self-pay | Admitting: Speech Pathology

## 2014-08-20 DIAGNOSIS — R569 Unspecified convulsions: Secondary | ICD-10-CM | POA: Insufficient documentation

## 2014-08-20 DIAGNOSIS — R4701 Aphasia: Secondary | ICD-10-CM | POA: Diagnosis not present

## 2014-08-20 NOTE — Therapy (Signed)
[place] MAIN [facility] SERVICES [address] [place], [place], [address] Phone: [phone]   Fax:  [phone]  Speech Language Pathology Discharge Summary  Patient Details  Name: Kathleen Mcguire MRN: [id number] Date of Birth: 1946/03/05 Referring Provider:  Anabel Bene, MD  Encounter Date: 08/20/2014      End of Session - 08/20/14 1442    Visit Number 4   Number of Visits 4   Date for SLP Re-Evaluation 08/26/14   SLP Start Time 1300   SLP Stop Time  7124   SLP Time Calculation (min) 58 min   Activity Tolerance Patient tolerated treatment well      Past Medical History  Diagnosis Date  . Allergy   . Seizures   . Stroke   . Hypertension   . Depression     Past Surgical History  Procedure Laterality Date  . Abdominal hysterectomy      There were no vitals filed for this visit.  Visit Diagnosis: Aphasia      Subjective Assessment - 08/20/14 1439    Subjective The patient reports that she can typically express herself effectively, although she has ercieved differences in her verbal expression.   Currently in Pain? No/denies          ADULT SLP TREATMENT - 08/20/14 1430    General Information   Behavior/Cognition Alert;Cooperative   HPI Pt is 3 months post encephalopathy and seizure acitivity, presenting with mild expressive aphasia   Treatment Provided   Treatment provided Cognitive-Linquistic   Pain Assessment   Pain Assessment No/denies pain   Cognitive-Linquistic Treatment   Treatment focused on Aphasia   Skilled Treatment Generate grammatical and meaningful sentence to complete moderately complex cognitive-linguistic task (verbalize causes and/or effects of hypothetical situation) with 80% accuracy and min SLP cues to be more specific and less vague.  Complete verbal analogies with  80% accuracy and min SLP cues to identify analogous relationship.   Assessment / Recommendations / Plan   Plan Discharge SLP  treatment due to (comment);All goals met   Progression Toward Goals   Progression toward goals Goals met, education completed, patient discharged from [facility] - 08/20/14 1441    Education provided Yes   Education Details Discussed that she has functional communication skills and will continue to improve as she uses her skills in self-expression.  Patient advised to get a new referral to speech therapy if she felt she needed more work.   Person(s) Educated Patient   Methods Explanation   Comprehension Verbalized understanding            SLP Long Term Goals - 08/20/14 1449    SLP LONG TERM GOAL #1   Title Patient will generate grammatical and cogent sentence given cognitive-linguistic task with 80% accuracy and min SLP cues to be achieved by 08/26/2014.   Status Achieved   SLP LONG TERM GOAL #2   Title Patient will complete high level word finding tasks with 80% accuracy and min SLP cues to be achieved by 08/26/2014.   Status Achieved          Plan - 08/20/14 1445    Clinical Impression Statement Pt communicates well in conversation with SLP during therapy, although she does require min cues for higher specificity. The patient has met her communication goals as written and we will plan to discharge today.  The patient was reminded to obtain a new referral  for speech therapy if she felt she was not able to effectively communicate.   Speech Therapy Frequency Other (comment)  Discharge today   Duration Other (comment)  Discharge today   Treatment/Interventions Language facilitation;Functional tasks;SLP instruction and feedback;Patient/family education   Potential to Achieve Goals Good   Potential Considerations Ability to learn/carryover information;Previous level of function;Family/community support   SLP Home Exercise Plan Patient reminded that talking is the best exercise for talking.   Consulted and Agree with Plan of Care Patient          G-Codes -  09/08/14 1450    Functional Assessment Tool Used Language facilitation activities, clinical judgment   Functional Limitations Spoken language expressive   Spoken Language Expression Current Status [phone]) At least 1 percent but less than 20 percent impaired, limited or restricted   Spoken Language Expression Goal Status ([id number]) At least 1 percent but less than 20 percent impaired, limited or restricted   Spoken Language Expression Discharge Status [phone]) At least 1 percent but less than 20 percent impaired, limited or restricted      Problem List Patient Active Problem List   Diagnosis Date Noted  . Seizures 07/29/2014  . Encephalopathy 07/29/2014    Plan: Patient agrees to discharge.  Patient goals were met. Patient is being discharged due to meeting the stated rehab goals.  ?????    Leroy Sea, MS/CCC- SLP  Lou Miner Sep 08, 2014, 2:56 PM  [place] MAIN [facility] SERVICES [address] [place], [place], [address] Phone: [phone]   Fax:  [phone]

## 2014-10-09 DIAGNOSIS — E1140 Type 2 diabetes mellitus with diabetic neuropathy, unspecified: Secondary | ICD-10-CM | POA: Diagnosis not present

## 2014-11-07 ENCOUNTER — Encounter: Payer: Self-pay | Admitting: Family Medicine

## 2014-11-07 ENCOUNTER — Ambulatory Visit (INDEPENDENT_AMBULATORY_CARE_PROVIDER_SITE_OTHER): Payer: Medicare Other | Admitting: Family Medicine

## 2014-11-07 VITALS — BP 154/63 | HR 69 | Temp 98.4°F | Ht 60.0 in | Wt 220.0 lb

## 2014-11-07 DIAGNOSIS — S98119A Complete traumatic amputation of unspecified great toe, initial encounter: Secondary | ICD-10-CM | POA: Insufficient documentation

## 2014-11-07 DIAGNOSIS — G894 Chronic pain syndrome: Secondary | ICD-10-CM

## 2014-11-07 DIAGNOSIS — Z23 Encounter for immunization: Secondary | ICD-10-CM

## 2014-11-07 DIAGNOSIS — E785 Hyperlipidemia, unspecified: Secondary | ICD-10-CM

## 2014-11-07 DIAGNOSIS — I25119 Atherosclerotic heart disease of native coronary artery with unspecified angina pectoris: Secondary | ICD-10-CM

## 2014-11-07 DIAGNOSIS — S030XXA Dislocation of jaw, initial encounter: Secondary | ICD-10-CM

## 2014-11-07 DIAGNOSIS — S81801A Unspecified open wound, right lower leg, initial encounter: Secondary | ICD-10-CM

## 2014-11-07 DIAGNOSIS — Z89612 Acquired absence of left leg above knee: Secondary | ICD-10-CM

## 2014-11-07 DIAGNOSIS — I10 Essential (primary) hypertension: Secondary | ICD-10-CM

## 2014-11-07 DIAGNOSIS — M961 Postlaminectomy syndrome, not elsewhere classified: Secondary | ICD-10-CM

## 2014-11-07 DIAGNOSIS — E1142 Type 2 diabetes mellitus with diabetic polyneuropathy: Secondary | ICD-10-CM | POA: Diagnosis not present

## 2014-11-07 DIAGNOSIS — Z794 Long term (current) use of insulin: Secondary | ICD-10-CM | POA: Diagnosis not present

## 2014-11-07 DIAGNOSIS — G5601 Carpal tunnel syndrome, right upper limb: Secondary | ICD-10-CM

## 2014-11-07 DIAGNOSIS — H3523 Other non-diabetic proliferative retinopathy, bilateral: Secondary | ICD-10-CM

## 2014-11-07 DIAGNOSIS — E113599 Type 2 diabetes mellitus with proliferative diabetic retinopathy without macular edema, unspecified eye: Secondary | ICD-10-CM

## 2014-11-07 DIAGNOSIS — S0300XA Dislocation of jaw, unspecified side, initial encounter: Secondary | ICD-10-CM

## 2014-11-07 DIAGNOSIS — G629 Polyneuropathy, unspecified: Secondary | ICD-10-CM | POA: Diagnosis not present

## 2014-11-07 DIAGNOSIS — M797 Fibromyalgia: Secondary | ICD-10-CM | POA: Insufficient documentation

## 2014-11-07 DIAGNOSIS — E162 Hypoglycemia, unspecified: Secondary | ICD-10-CM | POA: Diagnosis not present

## 2014-11-07 DIAGNOSIS — G9340 Encephalopathy, unspecified: Secondary | ICD-10-CM

## 2014-11-07 DIAGNOSIS — E11359 Type 2 diabetes mellitus with proliferative diabetic retinopathy without macular edema: Secondary | ICD-10-CM | POA: Diagnosis not present

## 2014-11-07 DIAGNOSIS — R569 Unspecified convulsions: Secondary | ICD-10-CM

## 2014-11-07 DIAGNOSIS — H269 Unspecified cataract: Secondary | ICD-10-CM

## 2014-11-07 DIAGNOSIS — G5600 Carpal tunnel syndrome, unspecified upper limb: Secondary | ICD-10-CM | POA: Insufficient documentation

## 2014-11-07 DIAGNOSIS — H3520 Other non-diabetic proliferative retinopathy, unspecified eye: Secondary | ICD-10-CM | POA: Insufficient documentation

## 2014-11-07 DIAGNOSIS — R4701 Aphasia: Secondary | ICD-10-CM

## 2014-11-07 DIAGNOSIS — Z89619 Acquired absence of unspecified leg above knee: Secondary | ICD-10-CM | POA: Insufficient documentation

## 2014-11-07 DIAGNOSIS — G5603 Carpal tunnel syndrome, bilateral upper limbs: Secondary | ICD-10-CM

## 2014-11-07 DIAGNOSIS — I2510 Atherosclerotic heart disease of native coronary artery without angina pectoris: Secondary | ICD-10-CM | POA: Insufficient documentation

## 2014-11-07 DIAGNOSIS — G64 Other disorders of peripheral nervous system: Secondary | ICD-10-CM | POA: Insufficient documentation

## 2014-11-07 DIAGNOSIS — G5602 Carpal tunnel syndrome, left upper limb: Secondary | ICD-10-CM

## 2014-11-07 NOTE — Assessment & Plan Note (Signed)
Stable at this time. Continue to follow with ENT. Work on stretches.

## 2014-11-07 NOTE — Assessment & Plan Note (Signed)
Continue to follow with pain clinic. Continue to monitor. Under good control at this time.

## 2014-11-07 NOTE — Assessment & Plan Note (Signed)
BP elevated today. Likely due to anxiety about 1st visit. Will have her follow with cardiology as needed.

## 2014-11-07 NOTE — Assessment & Plan Note (Signed)
Continue to follow with cardiology as needed. No chest pain today. Keep DM under control

## 2014-11-07 NOTE — Patient Instructions (Signed)
Td Vaccine (Tetanus and Diphtheria): What You Need to Know 1. Why get vaccinated? Tetanus  and diphtheria are very serious diseases. They are rare in the [place] today, but people who do become infected often have severe complications. Td vaccine is used to protect adolescents and adults from both of these diseases. Both tetanus and diphtheria are infections caused by bacteria. Diphtheria spreads from person to person through coughing or sneezing. Tetanus-causing bacteria enter the body through cuts, scratches, or wounds. TETANUS (Lockjaw) causes painful muscle tightening and stiffness, usually all over the body.  It can lead to tightening of muscles in the head and neck so you can't open your mouth, swallow, or sometimes even breathe. Tetanus kills about 1 out of every 5 people who are infected. DIPHTHERIA can cause a thick coating to form in the back of the throat.  It can lead to breathing problems, paralysis, heart failure, and death. Before vaccines, the [place] saw as many as 200,000 cases a year of diphtheria and hundreds of cases of tetanus. Since vaccination began, cases of both diseases have dropped by about 99%. 2. Td vaccine Td vaccine can protect adolescents and adults from tetanus and diphtheria. Td is usually given as a booster dose every 10 years but it can also be given earlier after a severe and dirty wound or burn. Your doctor can give you more information. Td may safely be given at the same time as other vaccines. 3. Some people should not get this vaccine  If you ever had a life-threatening allergic reaction after a dose of any tetanus or diphtheria containing vaccine, OR if you have a severe allergy to any part of this vaccine, you should not get Td. Tell your doctor if you have any severe allergies.  Talk to your doctor if you:  have epilepsy or another nervous system problem,  had severe pain or swelling after any vaccine containing diphtheria or  tetanus,  ever had Guillain Barr Syndrome (GBS),  aren't feeling well on the day the shot is scheduled. 4. Risks of a vaccine reaction With a vaccine, like any medicine, there is a chance of side effects. These are usually mild and go away on their own. Serious side effects are also possible, but are very rare. Most people who get Td vaccine do not have any problems with it. Mild Problems  following Td (Did not interfere with activities)  Pain where the shot was given (about 8 people in 10)  Redness or swelling where the shot was given (about 1 person in 3)  Mild fever (about 1 person in 15)  Headache or Tiredness (uncommon) Moderate Problems following Td (Interfered with activities, but did not require medical attention)  Fever over 102F (rare) Severe Problems  following Td (Unable to perform usual activities; required medical attention)  Swelling, severe pain, bleeding and/or redness in the arm where the shot was given (rare). Problems that could happen after any vaccine:  Brief fainting spells can happen after any medical procedure, including vaccination. Sitting or lying down for about 15 minutes can help prevent fainting, and injuries caused by a fall. Tell your doctor if you feel dizzy, or have vision changes or ringing in the ears.  Severe shoulder pain and reduced range of motion in the arm where a shot was given can happen, very rarely, after a vaccination.  Severe allergic reactions from a vaccine are very rare, estimated at less than 1 in a million doses. If one were to occur,  it would usually be within a few minutes to a few hours after the vaccination. 5. What if there is a serious reaction? What should I look for?  Look for anything that concerns you, such as signs of a severe allergic reaction, very high fever, or behavior changes. Signs of a severe allergic reaction can include hives, swelling of the face and throat, difficulty breathing, a fast heartbeat,  dizziness, and weakness. These would usually start a few minutes to a few hours after the vaccination. What should I do?  If you think it is a severe allergic reaction or other emergency that can't wait, call 9-1-1 or get the person to the nearest hospital. Otherwise, call your doctor.  Afterward, the reaction should be reported to the Vaccine Adverse Event Reporting System (VAERS). Your doctor might file this report, or you can do it yourself through the VAERS web site at www.vaers.SamedayNews.es, or by calling (224) 795-7291. VAERS is only for reporting reactions. They do not give medical advice. 6. The National Vaccine Injury Compensation Program The [employer] Vaccine Injury Compensation Program (VICP) is a federal program that was created to compensate people who may have been injured by certain vaccines. Persons who believe they may have been injured by a vaccine can learn about the program and about filing a claim by calling 2897267112 or visiting the [place] website at GoldCloset.com.ee. 7. How can I learn more?  Ask your doctor.  Contact your local or state health department.  Contact the Centers for Disease Control and Prevention (CDC):  Call 803-856-5380 (1-800-CDC-INFO)  Visit CDC's website at http://hunter.com/ CDC Td Vaccine Interim VIS (05/01/12) Document Released: 01/09/2006 Document Revised: 07/29/2013 Document Reviewed: 06/26/2013 [facility] Patient Information 2015 [place], [place]. This information is not intended to replace advice given to you by your health care Yanuel Tagg. Make sure you discuss any questions you have with your health care Merlene Dante. Health Maintenance Adopting a healthy lifestyle and getting preventive care can go a long way to promote health and wellness. Talk with your health care Adelaido Nicklaus about what schedule of regular examinations is right for you. This is a good chance for you to check in with your Chameka Mcmullen about disease prevention and staying  healthy. In between checkups, there are plenty of things you can do on your own. Experts have done a lot of research about which lifestyle changes and preventive measures are most likely to keep you healthy. Ask your health care Awesome Jared for more information. WEIGHT AND DIET  Eat a healthy diet  Be sure to include plenty of vegetables, fruits, low-fat dairy products, and lean protein.  Do not eat a lot of foods high in solid fats, added sugars, or salt.  Get regular exercise. This is one of the most important things you can do for your health.  Most adults should exercise for at least 150 minutes each week. The exercise should increase your heart rate and make you sweat (moderate-intensity exercise).  Most adults should also do strengthening exercises at least twice a week. This is in addition to the moderate-intensity exercise.  Maintain a healthy weight  Body mass index (BMI) is a measurement that can be used to identify possible weight problems. It estimates body fat based on height and weight. Your health care Juda Lajeunesse can help determine your BMI and help you achieve or maintain a healthy weight.  For females 17 years of age and older:   A BMI below 18.5 is considered underweight.  A BMI of 18.5 to 24.9 is normal.  A BMI of 25 to 29.9 is considered overweight.  A BMI of 30 and above is considered obese.  Watch levels of cholesterol and blood lipids  You should start having your blood tested for lipids and cholesterol at 70 years of age, then have this test every 5 years.  You may need to have your cholesterol levels checked more often if:  Your lipid or cholesterol levels are high.  You are older than 70 years of age.  You are at high risk for heart disease.  CANCER SCREENING   Lung Cancer  Lung cancer screening is recommended for adults 41-58 years old who are at high risk for lung cancer because of a history of smoking.  A yearly low-dose CT scan of the lungs is  recommended for people who:  Currently smoke.  Have quit within the past 15 years.  Have at least a 30-pack-year history of smoking. A pack year is smoking an average of one pack of cigarettes a day for 1 year.  Yearly screening should continue until it has been 15 years since you quit.  Yearly screening should stop if you develop a health problem that would prevent you from having lung cancer treatment.  Breast Cancer  Practice breast self-awareness. This means understanding how your breasts normally appear and feel.  It also means doing regular breast self-exams. Let your health care Corin Tilly know about any changes, no matter how small.  If you are in your 20s or 30s, you should have a clinical breast exam (CBE) by a health care Tremeka Helbling every 1-3 years as part of a regular health exam.  If you are 65 or older, have a CBE every year. Also consider having a breast X-ray (mammogram) every year.  If you have a family history of breast cancer, talk to your health care Trachelle Low about genetic screening.  If you are at high risk for breast cancer, talk to your health care Daemien Fronczak about having an MRI and a mammogram every year.  Breast cancer gene (BRCA) assessment is recommended for women who have family members with BRCA-related cancers. BRCA-related cancers include:  Breast.  Ovarian.  Tubal.  Peritoneal cancers.  Results of the assessment will determine the need for genetic counseling and BRCA1 and BRCA2 testing. Cervical Cancer Routine pelvic examinations to screen for cervical cancer are no longer recommended for nonpregnant women who are considered low risk for cancer of the pelvic organs (ovaries, uterus, and vagina) and who do not have symptoms. A pelvic examination may be necessary if you have symptoms including those associated with pelvic infections. Ask your health care Rumor Sun if a screening pelvic exam is right for you.   The Pap test is the screening test for  cervical cancer for women who are considered at risk.  If you had a hysterectomy for a problem that was not cancer or a condition that could lead to cancer, then you no longer need Pap tests.  If you are older than 65 years, and you have had normal Pap tests for the past 10 years, you no longer need to have Pap tests.  If you have had past treatment for cervical cancer or a condition that could lead to cancer, you need Pap tests and screening for cancer for at least 20 years after your treatment.  If you no longer get a Pap test, assess your risk factors if they change (such as having a new sexual partner). This can affect whether you should start being screened again.  Some women have medical problems that increase their chance of getting cervical cancer. If this is the case for you, your health care Fatina Sprankle may recommend more frequent screening and Pap tests.  The human papillomavirus (HPV) test is another test that may be used for cervical cancer screening. The HPV test looks for the virus that can cause cell changes in the cervix. The cells collected during the Pap test can be tested for HPV.  The HPV test can be used to screen women 14 years of age and older. Getting tested for HPV can extend the interval between normal Pap tests from three to five years.  An HPV test also should be used to screen women of any age who have unclear Pap test results.  After 70 years of age, women should have HPV testing as often as Pap tests.  Colorectal Cancer  This type of cancer can be detected and often prevented.  Routine colorectal cancer screening usually begins at 70 years of age and continues through 70 years of age.  Your health care Katlynne Mckercher may recommend screening at an earlier age if you have risk factors for colon cancer.  Your health care Citlaly Camplin may also recommend using home test kits to check for hidden blood in the stool.  A small camera at the end of a tube can be used to examine  your colon directly (sigmoidoscopy or colonoscopy). This is done to check for the earliest forms of colorectal cancer.  Routine screening usually begins at age 16.  Direct examination of the colon should be repeated every 5-10 years through 70 years of age. However, you may need to be screened more often if early forms of precancerous polyps or small growths are found. Skin Cancer  Check your skin from head to toe regularly.  Tell your health care Vrinda Heckstall about any new moles or changes in moles, especially if there is a change in a mole's shape or color.  Also tell your health care Catelynn Sparger if you have a mole that is larger than the size of a pencil eraser.  Always use sunscreen. Apply sunscreen liberally and repeatedly throughout the day.  Protect yourself by wearing long sleeves, pants, a wide-brimmed hat, and sunglasses whenever you are outside. HEART DISEASE, DIABETES, AND HIGH BLOOD PRESSURE   Have your blood pressure checked at least every 1-2 years. High blood pressure causes heart disease and increases the risk of stroke.  If you are between 48 years and 61 years old, ask your health care Cara Thaxton if you should take aspirin to prevent strokes.  Have regular diabetes screenings. This involves taking a blood sample to check your fasting blood sugar level.  If you are at a normal weight and have a low risk for diabetes, have this test once every three years after 70 years of age.  If you are overweight and have a high risk for diabetes, consider being tested at a younger age or more often. PREVENTING INFECTION  Hepatitis B  If you have a higher risk for hepatitis B, you should be screened for this virus. You are considered at high risk for hepatitis B if:  You were born in a country where hepatitis B is common. Ask your health care Rowyn Mustapha which countries are considered high risk.  Your parents were born in a high-risk country, and you have not been immunized against  hepatitis B (hepatitis B vaccine).  You have HIV or AIDS.  You use needles to inject street drugs.  You  live with someone who has hepatitis B.  You have had sex with someone who has hepatitis B.  You get hemodialysis treatment.  You take certain medicines for conditions, including cancer, organ transplantation, and autoimmune conditions. Hepatitis C  Blood testing is recommended for:  Everyone born from [birth year] through 1966.  Anyone with known risk factors for hepatitis C. Sexually transmitted infections (STIs)  You should be screened for sexually transmitted infections (STIs) including gonorrhea and chlamydia if:  You are sexually active and are younger than 70 years of age.  You are older than 70 years of age and your health care Emmanuela Ghazi tells you that you are at risk for this type of infection.  Your sexual activity has changed since you were last screened and you are at an increased risk for chlamydia or gonorrhea. Ask your health care Abdoulie Tierce if you are at risk.  If you do not have HIV, but are at risk, it may be recommended that you take a prescription medicine daily to prevent HIV infection. This is called pre-exposure prophylaxis (PrEP). You are considered at risk if:  You are sexually active and do not regularly use condoms or know the HIV status of your partner(s).  You take drugs by injection.  You are sexually active with a partner who has HIV. Talk with your health care Toussaint Golson about whether you are at high risk of being infected with HIV. If you choose to begin PrEP, you should first be tested for HIV. You should then be tested every 3 months for as long as you are taking PrEP.  PREGNANCY   If you are premenopausal and you may become pregnant, ask your health care Donneisha Beane about preconception counseling.  If you may become pregnant, take 400 to 800 micrograms (mcg) of folic acid every day.  If you want to prevent pregnancy, talk to your health care Emmersen Garraway  about birth control (contraception). OSTEOPOROSIS AND MENOPAUSE   Osteoporosis is a disease in which the bones lose minerals and strength with aging. This can result in serious bone fractures. Your risk for osteoporosis can be identified using a bone density scan.  If you are 102 years of age or older, or if you are at risk for osteoporosis and fractures, ask your health care Jarmarcus Wambold if you should be screened.  Ask your health care Rhetta Cleek whether you should take a calcium or vitamin D supplement to lower your risk for osteoporosis.  Menopause may have certain physical symptoms and risks.  Hormone replacement therapy may reduce some of these symptoms and risks. Talk to your health care Merica Prell about whether hormone replacement therapy is right for you.  HOME CARE INSTRUCTIONS   Schedule regular health, dental, and eye exams.  Stay current with your immunizations.   Do not use any tobacco products including cigarettes, chewing tobacco, or electronic cigarettes.  If you are pregnant, do not drink alcohol.  If you are breastfeeding, limit how much and how often you drink alcohol.  Limit alcohol intake to no more than 1 drink per day for nonpregnant women. One drink equals 12 ounces of beer, 5 ounces of wine, or 1 ounces of hard liquor.  Do not use street drugs.  Do not share needles.  Ask your health care Marayah Higdon for help if you need support or information about quitting drugs.  Tell your health care Naydene Kamrowski if you often feel depressed.  Tell your health care Normand Damron if you have ever been abused or do not feel safe at  home. Document Released: 09/27/2010 Document Revised: 07/29/2013 Document Reviewed: 02/13/2013 [facility] Patient Information 2015 [place], [place]. This information is not intended to replace advice given to you by your health care Tniya Bowditch. Make sure you discuss any questions you have with your health care Maycie Luera.

## 2014-11-07 NOTE — Assessment & Plan Note (Signed)
Continue to follow with pain center. Continue to monitor. Stable right now.

## 2014-11-07 NOTE — Progress Notes (Signed)
BP 154/63 mmHg  Pulse 69  Temp(Src) 98.4 F (36.9 C)  Ht 5' (1.524 m)  Wt 220 lb (99.791 kg)  BMI 42.97 kg/m2  SpO2 97%   Subjective:    Patient ID: Kathleen Mcguire, female    DOB: 20-Jun-1945, 69 y.o.   MRN: [id number]  HPI: Kathleen Mcguire is a 69 y.o. female who presents today to establish care.   Chief Complaint  Patient presents with  . Establish Care    no problems   Had encephalopathy and seizures secondary to severe hypoglycemia x2 in December 2015. Follows with Dr. Malvin Johns from Neurology. Got out of Rehab at the end of May 2016.  Type 2 diabetes since 1980- follows with [facility] endocrinology. Was going too low on her sugars, so her insulin dose was dropped, however, she continued to take her same amount of insulin even after being advised to decrease her dose. Feels like she is doing better with her diabetes. Doesn't feel like she is going as low any more.   Chronic back pain following laminectomy- follows with [facility] pain management. On Tizanidine and Tramadol as well as topamax.   Still not feeling like herself, but feeling better. Doesn't feel like she will ever feel back to herself.  Did not take her BP medicine today.   PVD- follows with vascular. Has scratch on back on R leg, healing well  Dr. Malvin Johns- Neurology Dr. Levora Dredge- Endocrinology Dr. Presley Raddle- rad Dr. Sheran Spine- ENT Dr. Ether Griffins- Podaitry  No GYN. Hasn't had a mammogram done in a very long time  Thinks that she got both her Prevnar and Pneumovax  Not up to date on her Td  Relevant past medical, surgical, family and social history reviewed and updated as indicated. Interim medical history since our last visit reviewed. Allergies and medications reviewed and updated.  Review of Systems  Constitutional: Negative.   Respiratory: Negative.   Cardiovascular: Negative.   Gastrointestinal: Negative.   Musculoskeletal: Negative.   Skin: Negative.   Psychiatric/Behavioral: Negative.     Per HPI unless specifically  indicated above     Objective:    BP 154/63 mmHg  Pulse 69  Temp(Src) 98.4 F (36.9 C)  Ht 5' (1.524 m)  Wt 220 lb (99.791 kg)  BMI 42.97 kg/m2  SpO2 97%  Wt Readings from Last 3 Encounters:  11/07/14 220 lb (99.791 kg)    Physical Exam  Constitutional: She is oriented to person, place, and time. She appears well-developed and well-nourished. No distress.  HENT:  Head: Normocephalic and atraumatic.  Right Ear: Hearing normal.  Left Ear: Hearing normal.  Nose: Nose normal.  Eyes: Conjunctivae, EOM and lids are normal. Pupils are equal, round, and reactive to light. Right eye exhibits no discharge. Left eye exhibits no discharge. No scleral icterus.  Cardiovascular: Normal rate and regular rhythm.  Exam reveals no gallop and no friction rub.   Murmur heard.  Crescendo systolic murmur is present with a grade of 4/6  Pulmonary/Chest: Effort normal. No respiratory distress. She has no wheezes. She has no rales. She exhibits no tenderness.  Musculoskeletal: Normal range of motion. She exhibits edema.  L AKA amputation, stump well healed 2+ edema R ankle  Neurological: She is alert and oriented to person, place, and time.  Skin: Skin is warm, dry and intact. No rash noted. No erythema. No pallor.  Small scratch on the back of R calf, healing well  Psychiatric: She has a normal mood and affect. Her speech is normal  and behavior is normal. Judgment and thought content normal. Cognition and memory are normal.    Results for orders placed or performed in visit on 03/28/14  Urine culture  Result Value Ref Range   Micro Text Report         SOURCE: NO SOURCE INDICATED    COMMENT                   MIXED BACTERIAL ORGANISMS   COMMENT                   RESULTS SUGGESTIVE OF CONTAMINATION   ANTIBIOTIC                                                      Urinalysis, Complete  Result Value Ref Range   Color - urine Yellow    Clarity - urine Hazy    Glucose,UR 150 mg/dL 1-61 mg/dL    Bilirubin,UR Negative NEGATIVE   Ketone Negative NEGATIVE   Specific Gravity 1.009 1.003-1.030   Blood Negative NEGATIVE   Ph 6.0 4.5-8.0   Protein Negative NEGATIVE   Nitrite Negative NEGATIVE   Leukocyte Esterase 2+ NEGATIVE   RBC,UR 6 /HPF 0-5 /HPF   WBC UR 156 /HPF 0-5 /HPF   Bacteria TRACE NONE SEEN   Squamous Epithelial 4 /HPF    WBC Clump PRESENT       Assessment & Plan:   Problem List Items Addressed This Visit      Cardiovascular and Mediastinum   HTN (hypertension)    BP elevated today. Likely due to anxiety about 1st visit. Will have her follow with cardiology as needed.       Relevant Medications   atorvastatin (LIPITOR) 40 MG tablet   amLODipine (NORVASC) 10 MG tablet   losartan (COZAAR) 100 MG tablet   furosemide (LASIX) 40 MG tablet   aspirin EC 325 MG tablet   CAD (coronary artery disease)    Continue to follow with cardiology as needed. No chest pain today. Keep DM and lipids under control      Relevant Medications   atorvastatin (LIPITOR) 40 MG tablet   amLODipine (NORVASC) 10 MG tablet   losartan (COZAAR) 100 MG tablet   furosemide (LASIX) 40 MG tablet   aspirin EC 325 MG tablet   Atherosclerosis of coronary artery    Continue to follow with cardiology as needed. No chest pain today. Keep DM under control      Relevant Medications   atorvastatin (LIPITOR) 40 MG tablet   amLODipine (NORVASC) 10 MG tablet   losartan (COZAAR) 100 MG tablet   furosemide (LASIX) 40 MG tablet   aspirin EC 325 MG tablet     Endocrine   Type 2 diabetes mellitus with diabetic polyneuropathy - Primary    Due for recheck with endocrine shortly. Working on cutting back insulin. Continue to follow with them.       Relevant Medications   HUMULIN R 100 UNIT/ML injection   atorvastatin (LIPITOR) 40 MG tablet   losartan (COZAAR) 100 MG tablet   aspirin EC 325 MG tablet   insulin NPH Human (HUMULIN N) 100 UNIT/ML injection   Hypoglycemia    Continue to follow with  endocrinology. Check sugars. Be on the look out for lows.         Nervous  and Auditory   Encephalopathy    Continue to follow with neurology. Stable at this time. Avoid low sugars. Continue to monitor.       Polyneuropathy    Keep sugars under control. Continue to follow with neurology.       CTS (carpal tunnel syndrome)    Stable. Continue to monitor.      Relevant Medications   topiramate (TOPAMAX) 25 MG tablet   tiZANidine (ZANAFLEX) 4 MG tablet     Musculoskeletal and Integument   Fibromyalgia    Continue to follow with pain clinic. Continue to monitor. Under good control at this time.       Relevant Medications   ibuprofen (ADVIL,MOTRIN) 200 MG tablet   aspirin EC 325 MG tablet   naproxen sodium (ANAPROX) 220 MG tablet   TMJ (dislocation of temporomandibular joint)    Stable at this time. Continue to follow with ENT. Work on stretches.         Other   Seizures    Continue to follow with neurology. None recently.       Relevant Medications   topiramate (TOPAMAX) 25 MG tablet   Hyperlipidemia   Relevant Medications   atorvastatin (LIPITOR) 40 MG tablet   amLODipine (NORVASC) 10 MG tablet   losartan (COZAAR) 100 MG tablet   furosemide (LASIX) 40 MG tablet   aspirin EC 325 MG tablet   Long term current use of insulin   Cataracts, bilateral   Proliferative diabetic retinopathy   Relevant Medications   HUMULIN R 100 UNIT/ML injection   atorvastatin (LIPITOR) 40 MG tablet   losartan (COZAAR) 100 MG tablet   aspirin EC 325 MG tablet   insulin NPH Human (HUMULIN N) 100 UNIT/ML injection   S/P AKA (above knee amputation)    Stump well healed. Continue to follow with endocrine. Continue to monitor.       Aphasia    Will send back to speech therapy if needed. Continue to follow with neurology.      Chronic pain associated with significant psychosocial dysfunction    Continue to follow with pain management. Continue to monitor.       Cervical  post-laminectomy syndrome    Continue to follow with pain center. Continue to monitor. Stable right now.       Nondiabetic proliferative retinopathy    Continue to follow with eye doctor. Continue to monitor.        Other Visit Diagnoses    Open wound of leg, right, initial encounter        Relevant Orders    Td : Tetanus/diphtheria >7yo Preservative  free (Completed)        Follow up plan: Return in about 6 months (around 05/10/2015).

## 2014-11-07 NOTE — Assessment & Plan Note (Signed)
Continue to follow with eye doctor. Continue to monitor.

## 2014-11-07 NOTE — Assessment & Plan Note (Signed)
Stable.       - Continue to monitor

## 2014-11-07 NOTE — Assessment & Plan Note (Addendum)
Continue to follow with cardiology as needed. No chest pain today. Keep DM and lipids under control

## 2014-11-07 NOTE — Assessment & Plan Note (Signed)
Stump well healed. Continue to follow with endocrine. Continue to monitor.

## 2014-11-07 NOTE — Assessment & Plan Note (Signed)
Due for recheck with endocrine shortly. Working on cutting back insulin. Continue to follow with them.

## 2014-11-07 NOTE — Assessment & Plan Note (Signed)
Continue to follow with endocrinology. Check sugars. Be on the look out for lows.

## 2014-11-07 NOTE — Assessment & Plan Note (Signed)
Keep sugars under control. Continue to follow with neurology.

## 2014-11-07 NOTE — Assessment & Plan Note (Signed)
Continue to follow with neurology. None recently.

## 2014-11-07 NOTE — Assessment & Plan Note (Signed)
Will send back to speech therapy if needed. Continue to follow with neurology.

## 2014-11-07 NOTE — Assessment & Plan Note (Signed)
Continue to follow with pain management. Continue to monitor.

## 2014-11-07 NOTE — Assessment & Plan Note (Signed)
Continue to follow with neurology. Stable at this time. Avoid low sugars. Continue to monitor.

## 2014-11-14 DIAGNOSIS — E119 Type 2 diabetes mellitus without complications: Secondary | ICD-10-CM | POA: Diagnosis not present

## 2014-11-14 DIAGNOSIS — I10 Essential (primary) hypertension: Secondary | ICD-10-CM | POA: Diagnosis not present

## 2014-11-14 DIAGNOSIS — I639 Cerebral infarction, unspecified: Secondary | ICD-10-CM | POA: Diagnosis not present

## 2014-11-14 DIAGNOSIS — S88912A Complete traumatic amputation of left lower leg, level unspecified, initial encounter: Secondary | ICD-10-CM | POA: Diagnosis not present

## 2015-01-12 DIAGNOSIS — Z79899 Other long term (current) drug therapy: Secondary | ICD-10-CM | POA: Diagnosis not present

## 2015-01-12 DIAGNOSIS — E1141 Type 2 diabetes mellitus with diabetic mononeuropathy: Secondary | ICD-10-CM | POA: Diagnosis not present

## 2015-01-12 DIAGNOSIS — E11649 Type 2 diabetes mellitus with hypoglycemia without coma: Secondary | ICD-10-CM | POA: Diagnosis not present

## 2015-01-12 DIAGNOSIS — Z794 Long term (current) use of insulin: Secondary | ICD-10-CM | POA: Diagnosis not present

## 2015-01-12 DIAGNOSIS — E1169 Type 2 diabetes mellitus with other specified complication: Secondary | ICD-10-CM | POA: Diagnosis not present

## 2015-01-12 DIAGNOSIS — E782 Mixed hyperlipidemia: Secondary | ICD-10-CM | POA: Diagnosis not present

## 2015-01-12 DIAGNOSIS — E113599 Type 2 diabetes mellitus with proliferative diabetic retinopathy without macular edema, unspecified eye: Secondary | ICD-10-CM | POA: Diagnosis not present

## 2015-01-12 DIAGNOSIS — G5600 Carpal tunnel syndrome, unspecified upper limb: Secondary | ICD-10-CM | POA: Diagnosis not present

## 2015-01-12 DIAGNOSIS — I10 Essential (primary) hypertension: Secondary | ICD-10-CM | POA: Diagnosis not present

## 2015-01-12 DIAGNOSIS — G629 Polyneuropathy, unspecified: Secondary | ICD-10-CM | POA: Diagnosis not present

## 2015-01-12 DIAGNOSIS — M797 Fibromyalgia: Secondary | ICD-10-CM | POA: Diagnosis not present

## 2015-01-12 LAB — HEMOGLOBIN A1C: HEMOGLOBIN A1C: 6.1

## 2015-02-24 DIAGNOSIS — I10 Essential (primary) hypertension: Secondary | ICD-10-CM | POA: Diagnosis not present

## 2015-02-24 DIAGNOSIS — I70211 Atherosclerosis of native arteries of extremities with intermittent claudication, right leg: Secondary | ICD-10-CM | POA: Diagnosis not present

## 2015-02-24 DIAGNOSIS — I739 Peripheral vascular disease, unspecified: Secondary | ICD-10-CM | POA: Diagnosis not present

## 2015-02-24 DIAGNOSIS — I639 Cerebral infarction, unspecified: Secondary | ICD-10-CM | POA: Diagnosis not present

## 2015-02-24 DIAGNOSIS — S88912A Complete traumatic amputation of left lower leg, level unspecified, initial encounter: Secondary | ICD-10-CM | POA: Diagnosis not present

## 2015-02-25 IMAGING — CR MANDIBLE - 4+ VIEW
1 series · 6 of 6 positions shown · non-contrast
Comparison: None available

CLINICAL DATA: Acute TMJ mandibular pain

EXAM:
MANDIBLE - 4+ VIEW

[Series 1: dxr mandible comp with obliques · 0.14mm/px · 6 of 6 slices shown]
[im 1/6]
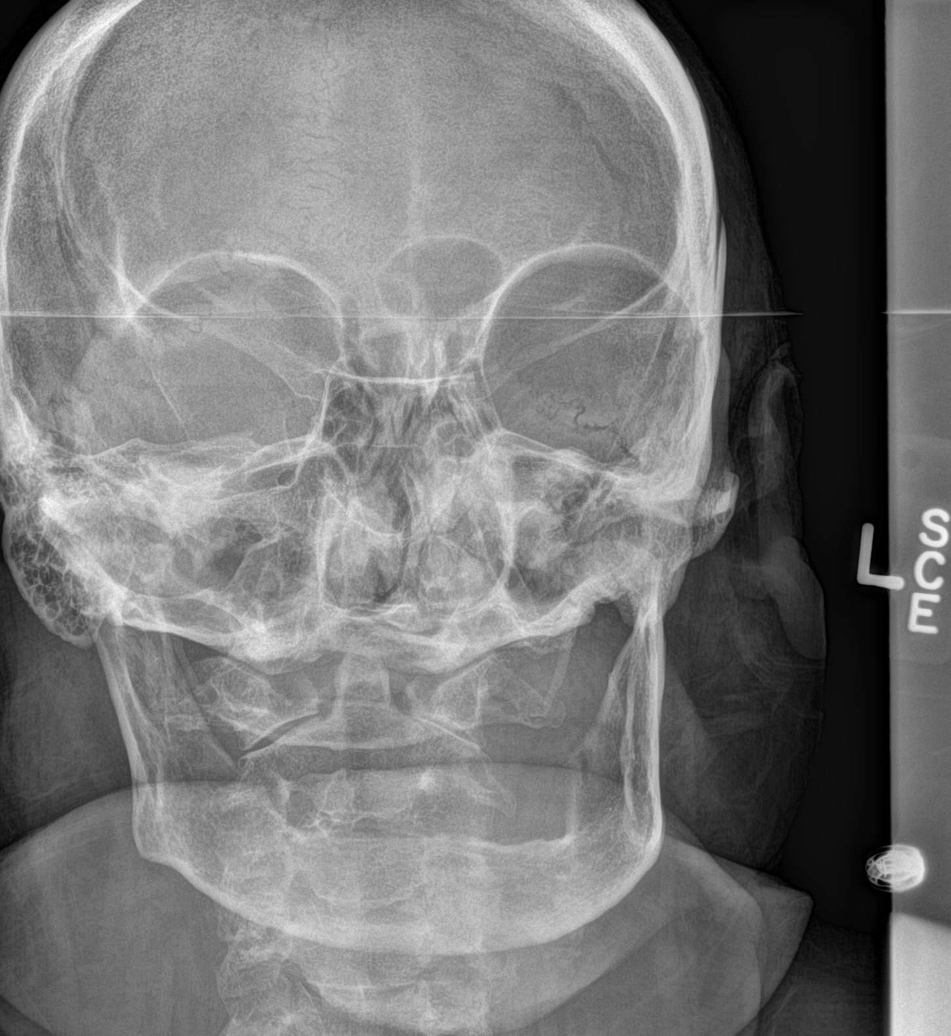
[im 2/6]
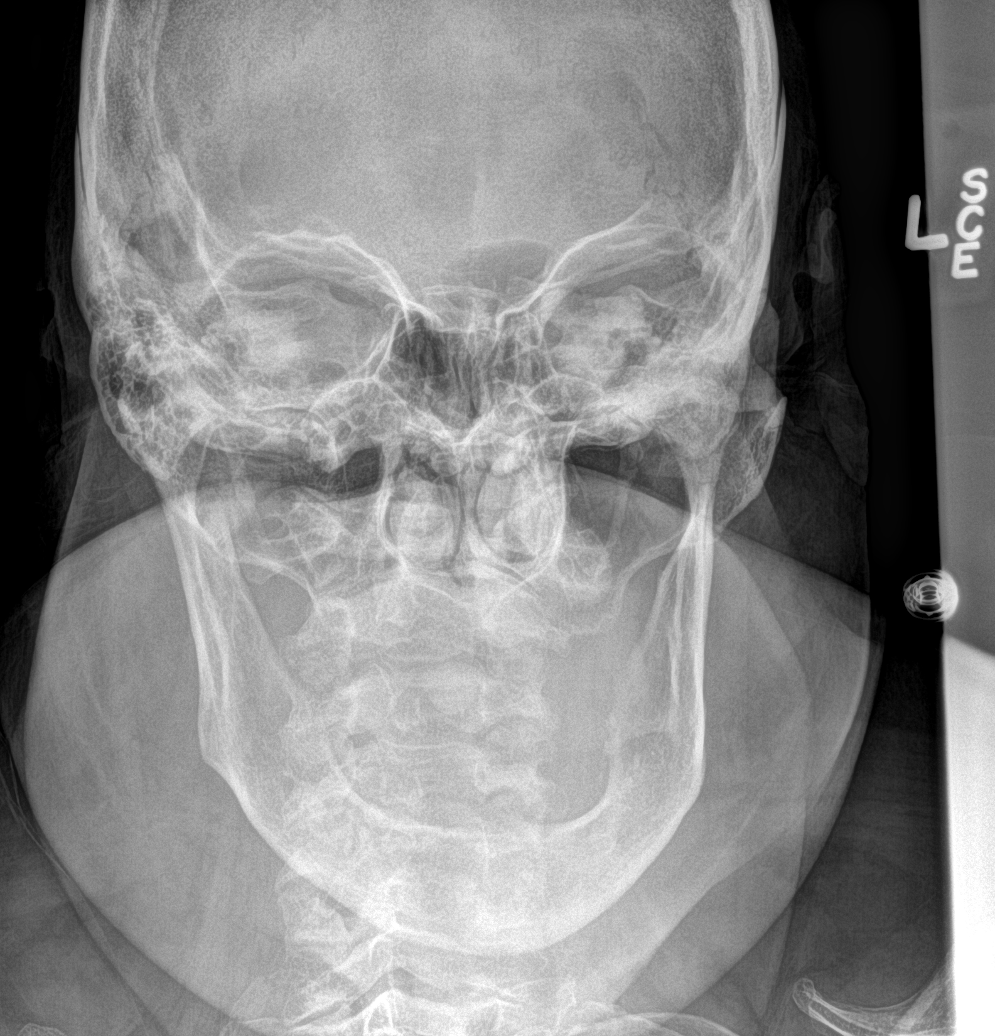
[im 3/6]
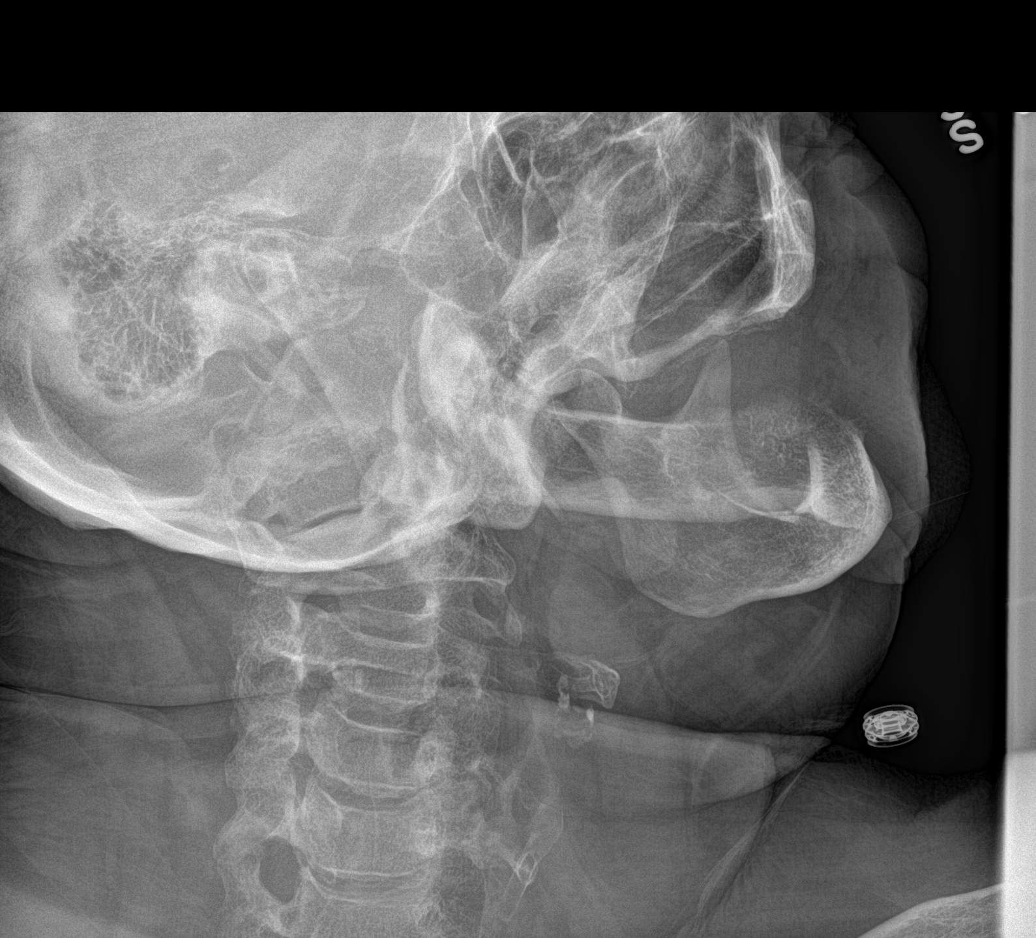
[im 4/6]
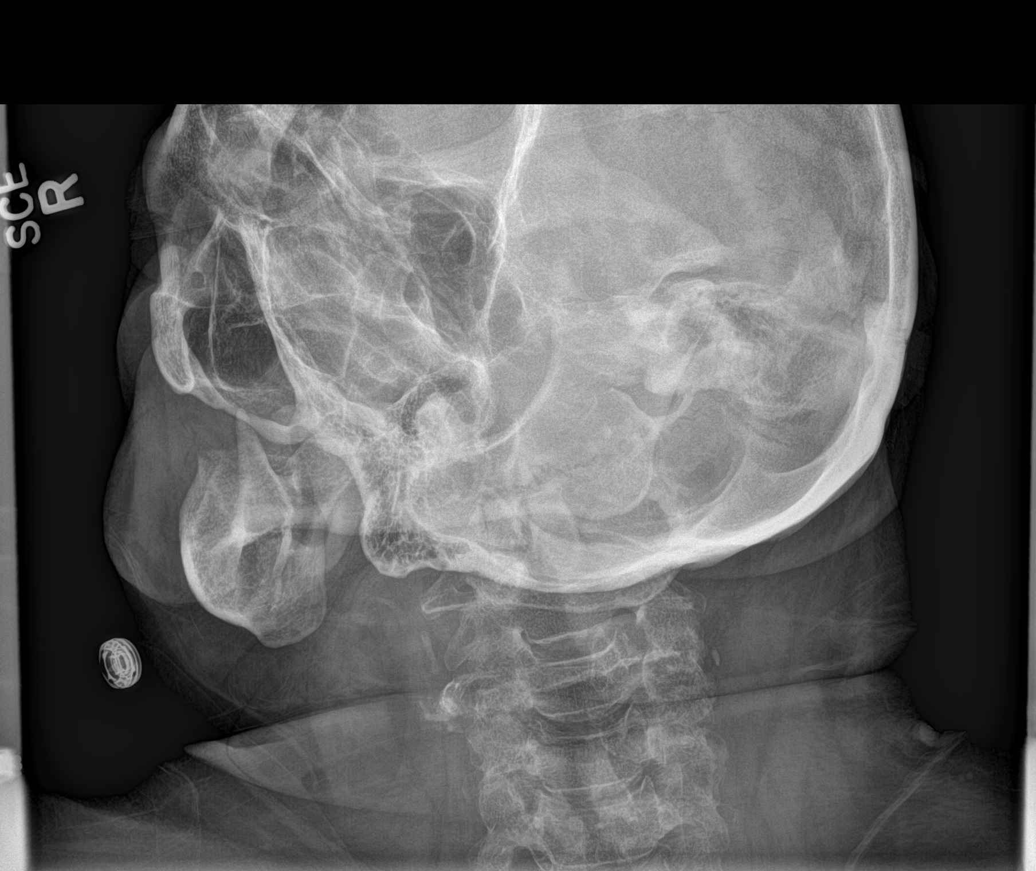
[im 5/6]
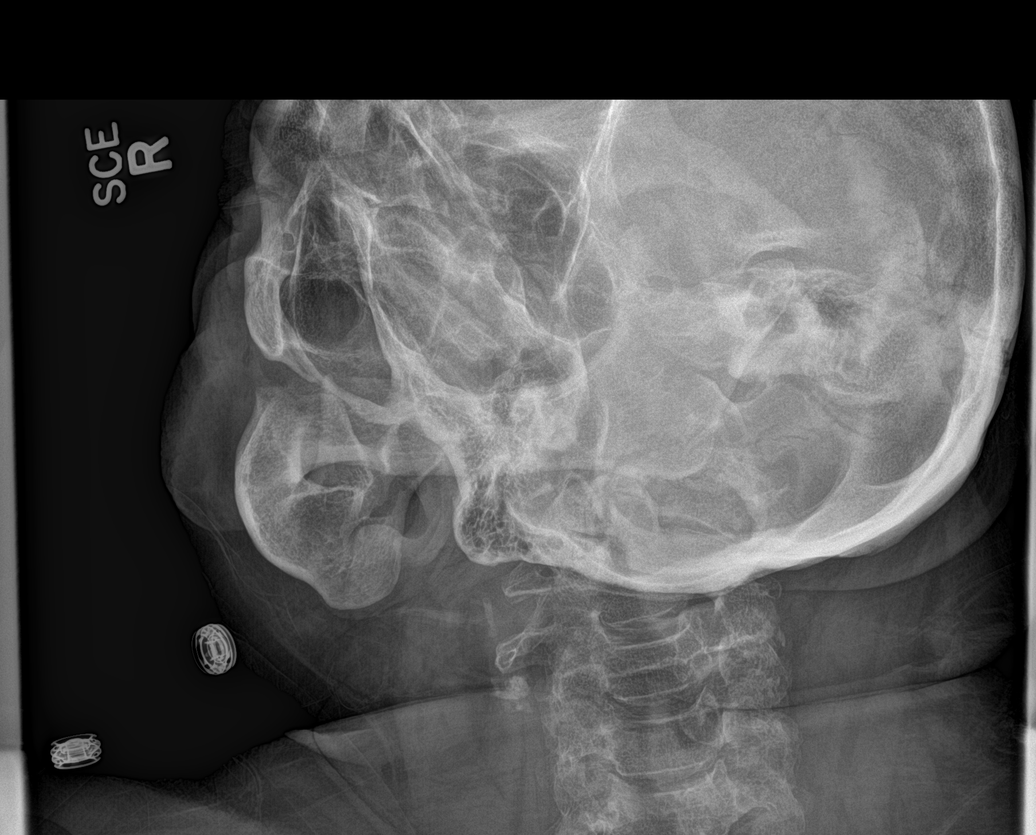
[im 6/6]
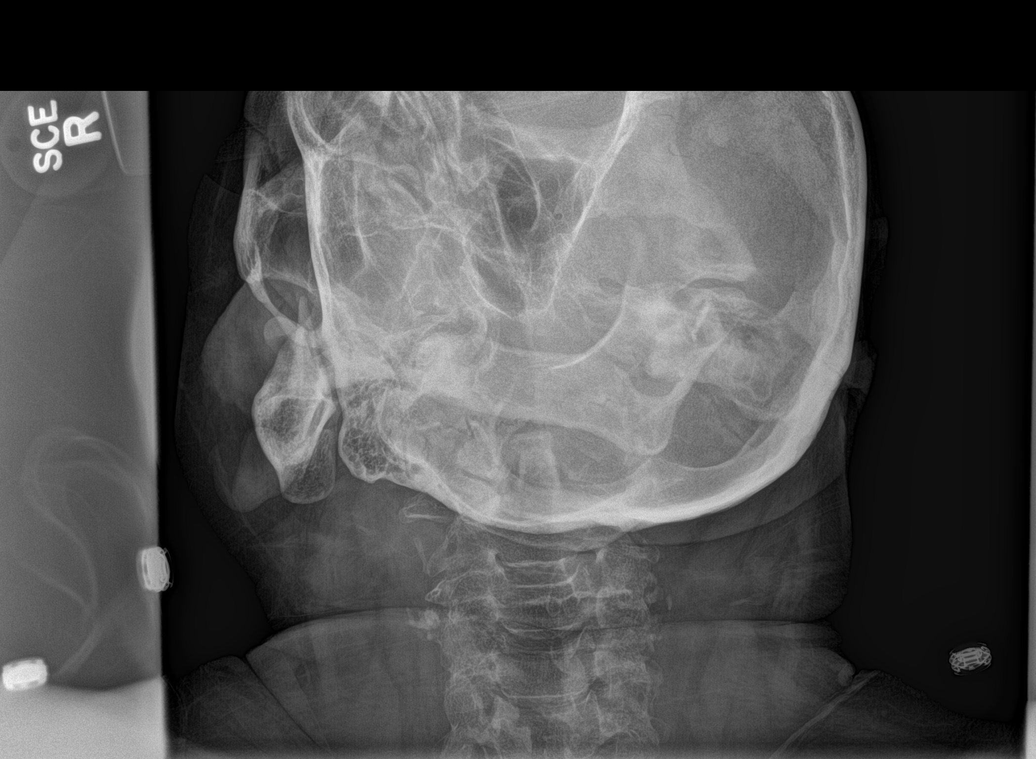

[6 of 6 positions shown; findings below may reference images not displayed]

FINDINGS: All teeth have been extracted. Mandible appears grossly intact
without displaced fracture. Orbits are symmetric. Visualized sinuses
clear.
IMPRESSION: No definite displaced mandible fracture or acute osseous finding by
plain radiography.

## 2015-02-26 IMAGING — CT CT HEAD WITHOUT CONTRAST
1 series · 16 of 30 positions shown, 20 images · non-contrast
Comparison: Head CT February 28, 2014; brain MRI March 01, 2014

CLINICAL DATA: Acute encephalopathy secondary to hypoglycemia.
Acute onset visual alteration

EXAM:
CT HEAD WITHOUT CONTRAST
TECHNIQUE: Contiguous axial images were obtained from the base of the skull
through the vertex without intravenous contrast.

[Series 2: head wo · axial · 0.41mm/px · z∈[+669,+802]mm · 16 of 32 slices shown, 20 images]
[im 2/32  brain]
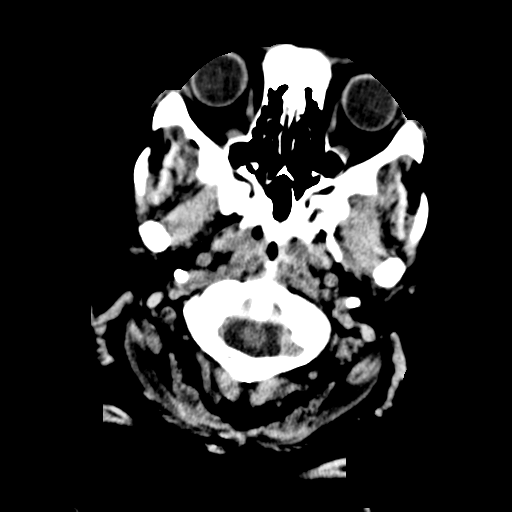
[im 2/32  bone]
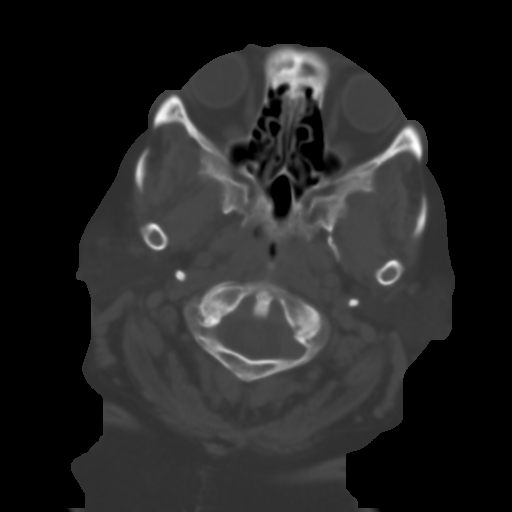
[im 4/32  brain]
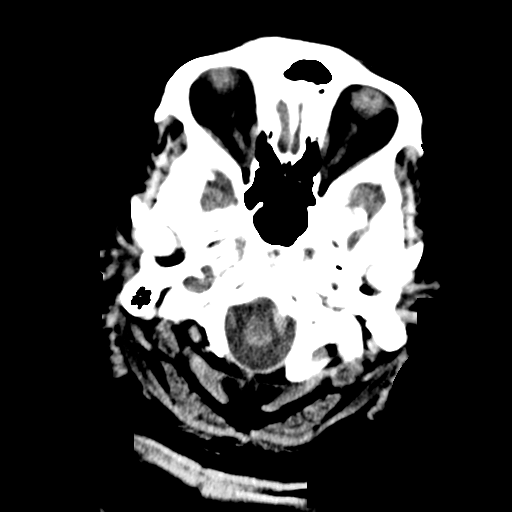
[im 6/32  brain]
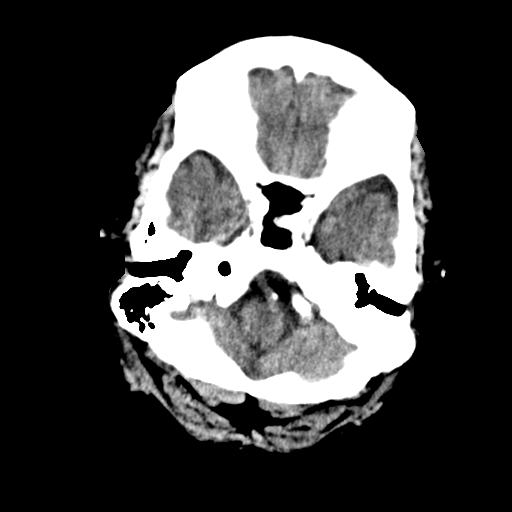
[im 8/32  brain]
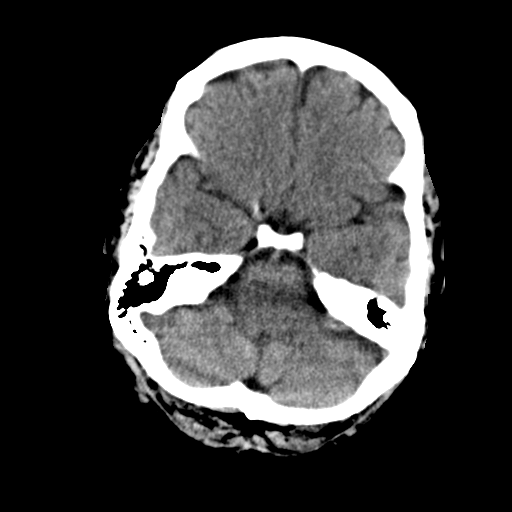
[im 9/32  brain]
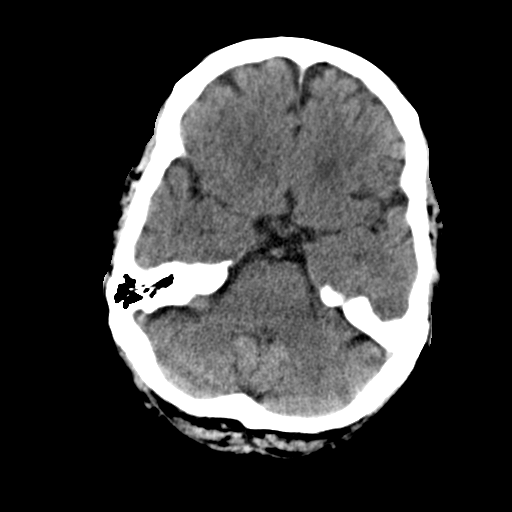
[im 9/32  bone]
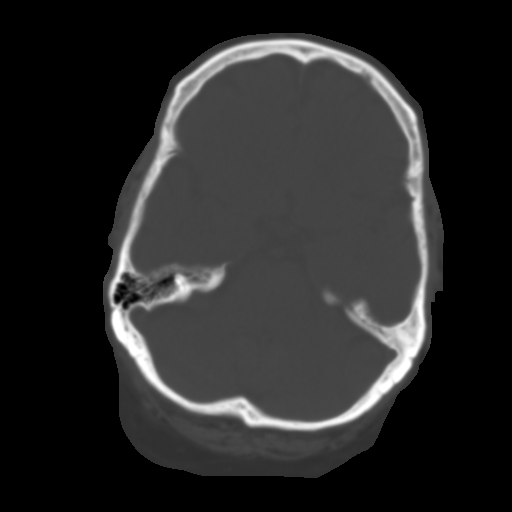
[im 11/32  brain]
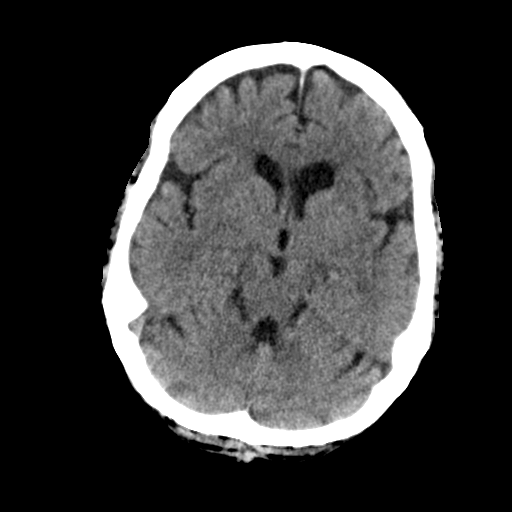
[im 13/32  brain]
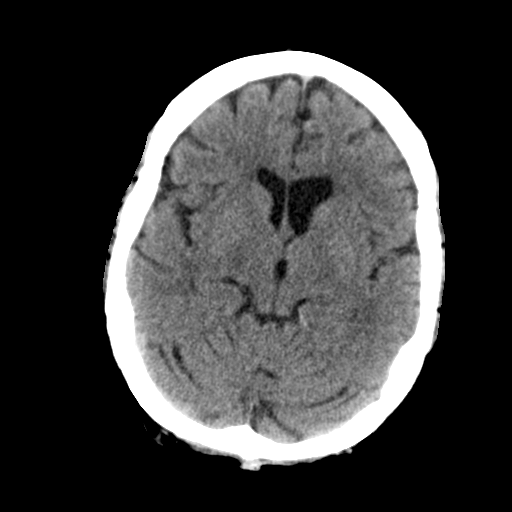
[im 15/32  brain]
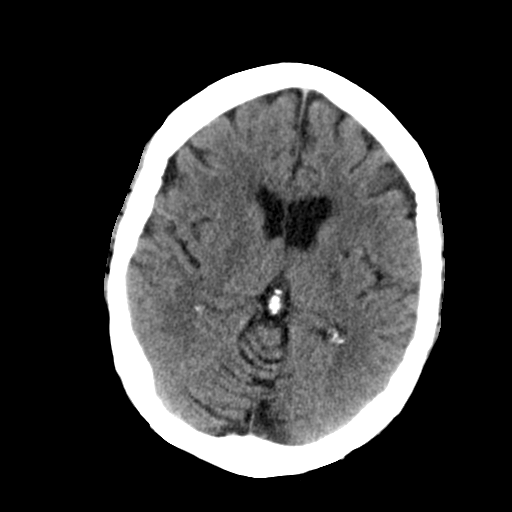
[im 17/32  brain]
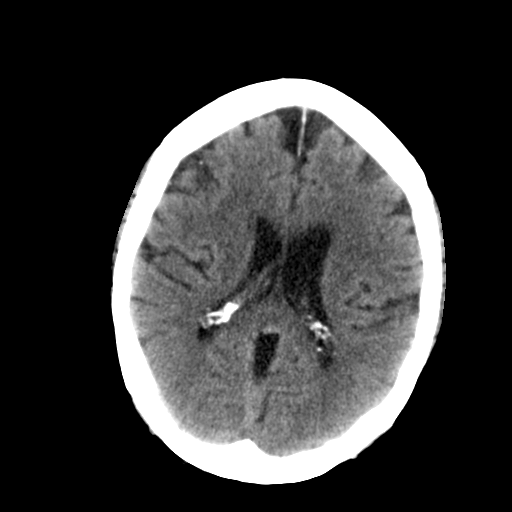
[im 17/32  bone]
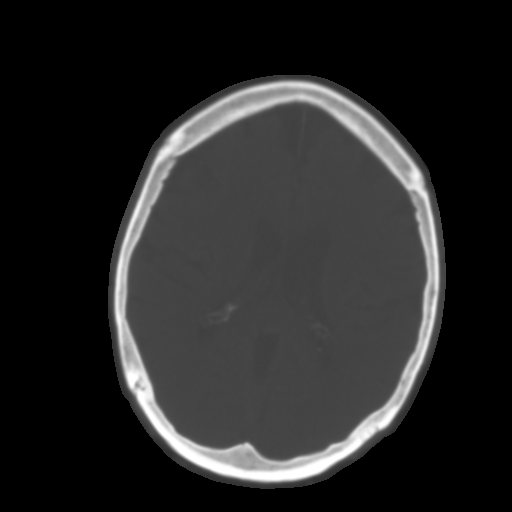
[im 19/32  brain]
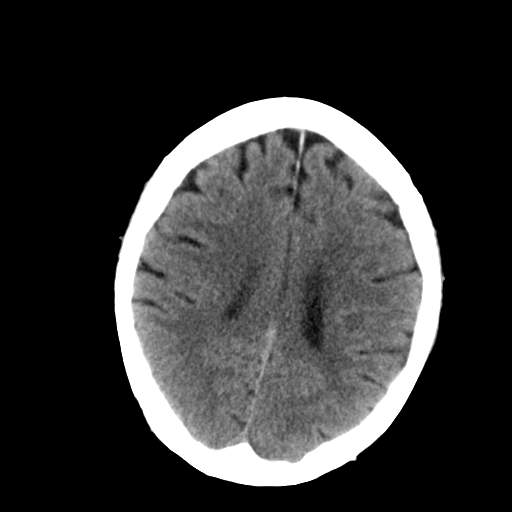
[im 21/32  brain]
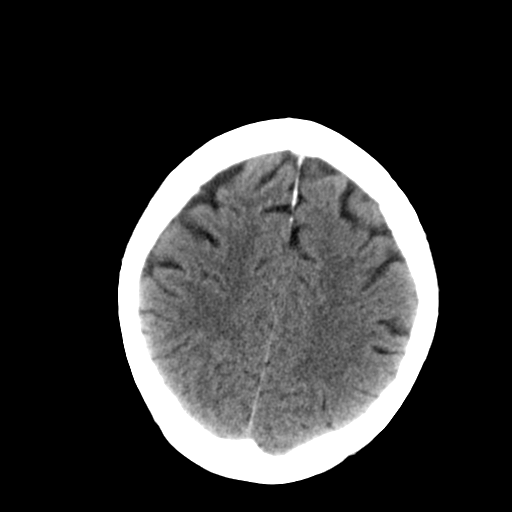
[im 23/32  brain]
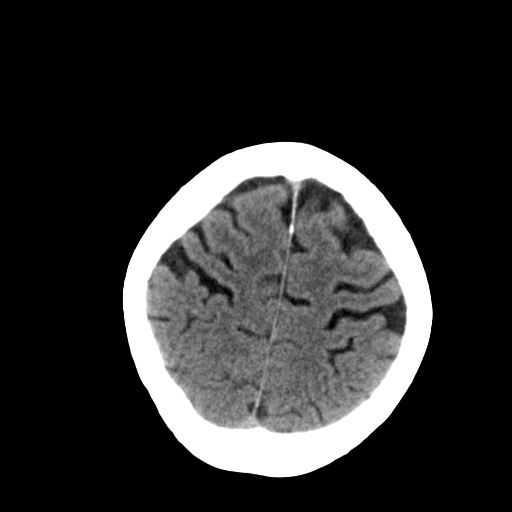
[im 24/32  brain]
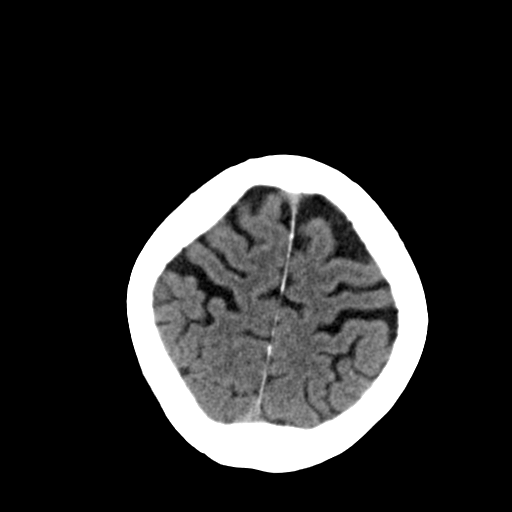
[im 24/32  bone]
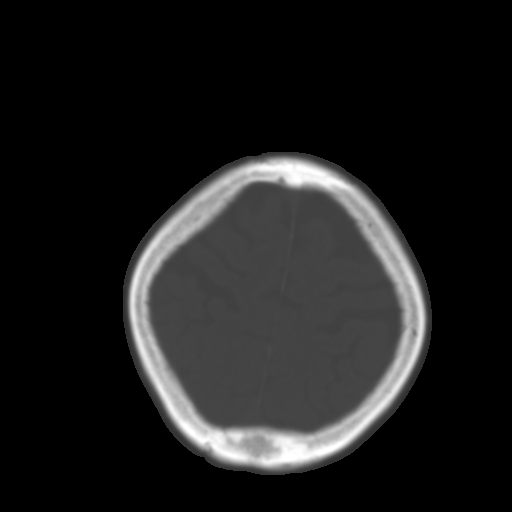
[im 26/32  brain]
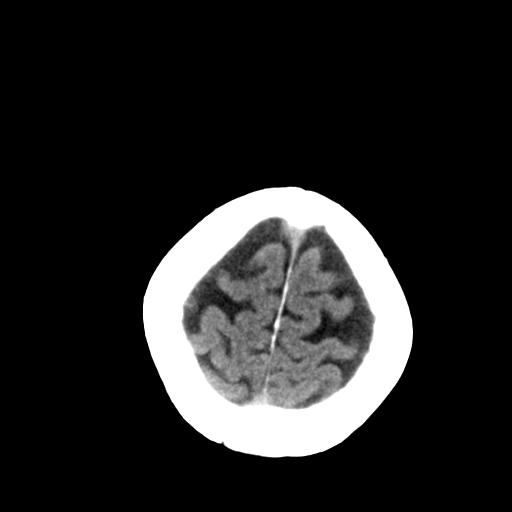
[im 28/32  brain]
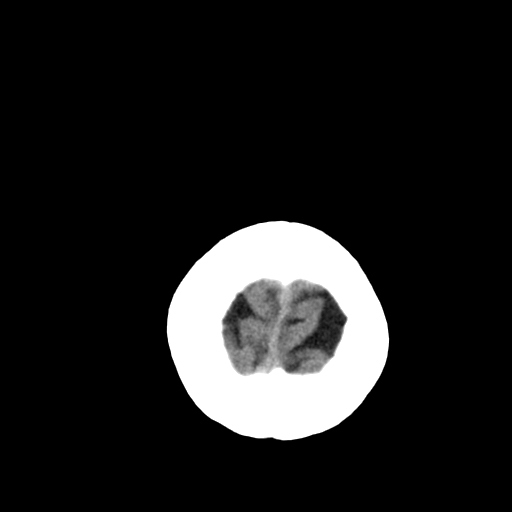
[im 30/32  brain]
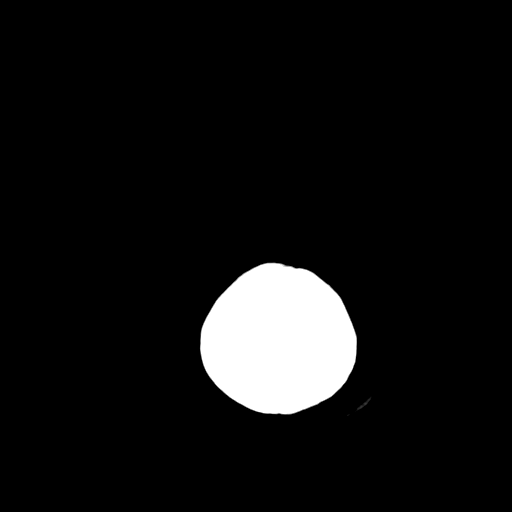

[16 of 30 positions shown; findings below may reference images not displayed]

FINDINGS: There is mild diffuse atrophy. The left lateral ventricle is
slightly larger than the right lateral ventricle, stable, a probable
anatomic variant. There is no intracranial mass, hemorrhage,
extra-axial fluid collection, or midline shift. There is mild small
vessel disease in the centra semiovale bilaterally. Elsewhere
gray-white compartments appear normal. No acute infarct apparent.
Bony calvarium appears intact. Mastoids on the right are clear.
Mastoids on the left are nearly aplastic, stable.
IMPRESSION: Mild atrophy with mild periventricular small vessel disease. No
intracranial mass, hemorrhage, or acute appearing infarct. Left
mastoids are nearly aplastic.

## 2015-03-06 DIAGNOSIS — E1165 Type 2 diabetes mellitus with hyperglycemia: Secondary | ICD-10-CM | POA: Diagnosis not present

## 2015-03-17 DIAGNOSIS — I639 Cerebral infarction, unspecified: Secondary | ICD-10-CM | POA: Diagnosis not present

## 2015-03-17 DIAGNOSIS — I739 Peripheral vascular disease, unspecified: Secondary | ICD-10-CM | POA: Diagnosis not present

## 2015-05-12 ENCOUNTER — Encounter: Payer: Self-pay | Admitting: Family Medicine

## 2015-05-12 ENCOUNTER — Ambulatory Visit (INDEPENDENT_AMBULATORY_CARE_PROVIDER_SITE_OTHER): Payer: Medicare Other | Admitting: Family Medicine

## 2015-05-12 VITALS — BP 146/73 | HR 66 | Temp 96.8°F

## 2015-05-12 DIAGNOSIS — G9340 Encephalopathy, unspecified: Secondary | ICD-10-CM | POA: Diagnosis not present

## 2015-05-12 DIAGNOSIS — Z89419 Acquired absence of unspecified great toe: Secondary | ICD-10-CM | POA: Diagnosis not present

## 2015-05-12 DIAGNOSIS — E162 Hypoglycemia, unspecified: Secondary | ICD-10-CM | POA: Diagnosis not present

## 2015-05-12 DIAGNOSIS — Z89612 Acquired absence of left leg above knee: Secondary | ICD-10-CM

## 2015-05-12 DIAGNOSIS — E785 Hyperlipidemia, unspecified: Secondary | ICD-10-CM

## 2015-05-12 DIAGNOSIS — E113599 Type 2 diabetes mellitus with proliferative diabetic retinopathy without macular edema, unspecified eye: Secondary | ICD-10-CM

## 2015-05-12 DIAGNOSIS — G629 Polyneuropathy, unspecified: Secondary | ICD-10-CM

## 2015-05-12 DIAGNOSIS — G894 Chronic pain syndrome: Secondary | ICD-10-CM

## 2015-05-12 DIAGNOSIS — E1142 Type 2 diabetes mellitus with diabetic polyneuropathy: Secondary | ICD-10-CM | POA: Diagnosis not present

## 2015-05-12 DIAGNOSIS — R569 Unspecified convulsions: Secondary | ICD-10-CM

## 2015-05-12 DIAGNOSIS — I25119 Atherosclerotic heart disease of native coronary artery with unspecified angina pectoris: Secondary | ICD-10-CM

## 2015-05-12 DIAGNOSIS — S98119A Complete traumatic amputation of unspecified great toe, initial encounter: Secondary | ICD-10-CM

## 2015-05-12 DIAGNOSIS — I10 Essential (primary) hypertension: Secondary | ICD-10-CM

## 2015-05-12 DIAGNOSIS — Z794 Long term (current) use of insulin: Secondary | ICD-10-CM

## 2015-05-12 DIAGNOSIS — Z23 Encounter for immunization: Secondary | ICD-10-CM

## 2015-05-12 NOTE — Assessment & Plan Note (Signed)
Checking CMP today. Slightly elevated. Cannot provide microalbumin. Continue current regimen. Continue to monitor.

## 2015-05-12 NOTE — Assessment & Plan Note (Signed)
Checking labs today. Continue to monitor. Continue to follow with endocrinology. Call with any concerns. Advised her to follow the directions of her endocrinologist who advised decreasing her insulin. She has not done this.  

## 2015-05-12 NOTE — Assessment & Plan Note (Signed)
Continue to follow with vascular. Continue current regimen. Continue to monitor. Stable.

## 2015-05-12 NOTE — Assessment & Plan Note (Signed)
Checking labs today. Continue to monitor. Continue to follow with cardiology. Call with any concerns.

## 2015-05-12 NOTE — Assessment & Plan Note (Signed)
Checking labs today. Continue to monitor. Continue to follow with endocrinology and opthalmology. Call with any concerns. Advised her to follow the directions of her endocrinologist who advised decreasing her insulin. She has not done this.  

## 2015-05-12 NOTE — Assessment & Plan Note (Signed)
Continue to follow with neurology and endocrine. Continue to monitor. Stable at this time.  

## 2015-05-12 NOTE — Assessment & Plan Note (Signed)
Continue to follow with neurology and endocrine. Continue to monitor. Stable at this time.

## 2015-05-12 NOTE — Assessment & Plan Note (Signed)
Continue to follow with [facility] clinic. Stable. Continue current regimen.

## 2015-05-12 NOTE — Progress Notes (Signed)
BP 146/73 mmHg  Pulse 66  Temp(Src) 96.8 F (36 C)  Ht   Wt   SpO2 95%   Subjective:    Patient ID: Kathleen Mcguire, female    DOB: 1945/12/20, 70 y.o.   MRN: [id number]  HPI: Kathleen Mcguire is a 70 y.o. female  Chief Complaint  Patient presents with  . Hypertension  . Hyperlipidemia  . Diabetes    Follows with endocrinology   HYPERTENSION / HYPERLIPIDEMIA Satisfied with current treatment? no Duration of hypertension: chronic BP monitoring frequency: rarely BP medication side effects: no Duration of hyperlipidemia: chronic Cholesterol medication side effects: no Cholesterol supplements: none Medication compliance: excellent compliance Aspirin: no Recent stressors: no Recurrent headaches: no Visual changes: no Palpitations: no Dyspnea: no Chest pain: no Lower extremity edema: no Dizzy/lightheaded: no  DIABETES- follows with endocrinology. They have been concerned about lows in the AM and decreased her medication, she did not decrease her medicine. She notes that she has been doing OK. She saw them in October and is due to see them again in March. Not seeing the podiatrist any more, just seeing the vascular doctor.  Hypoglycemic episodes:no Polydipsia/polyuria: no Visual disturbance: no Chest pain: no Paresthesias: no Glucose Monitoring: yes  Accucheck frequency: TID Taking Insulin?: yes Blood Pressure Monitoring: rarely Retinal Examination: Up to Date Foot Exam: Up to Date Diabetic Education: Completed Pneumovax: Up to Date Influenza: Up to Date Aspirin: yes   Relevant past medical, surgical, family and social history reviewed and updated as indicated. Interim medical history since our last visit reviewed. Allergies and medications reviewed and updated.  Review of Systems  Constitutional: Negative.   Respiratory: Negative.   Cardiovascular: Negative.   Musculoskeletal: Positive for myalgias, back pain and gait problem. Negative for joint swelling, neck  pain and neck stiffness.  Skin: Negative.   Neurological: Negative.   Psychiatric/Behavioral: Negative.     Per HPI unless specifically indicated above     Objective:    BP 146/73 mmHg  Pulse 66  Temp(Src) 96.8 F (36 C)  Ht   Wt   SpO2 95%  Wt Readings from Last 3 Encounters:  11/07/14 220 lb (99.791 kg)    Physical Exam  Constitutional: She is oriented to person, place, and time. She appears well-developed and well-nourished. No distress.  HENT:  Head: Normocephalic and atraumatic.  Right Ear: Hearing normal.  Left Ear: Hearing normal.  Nose: Nose normal.  Eyes: Conjunctivae and lids are normal. Right eye exhibits no discharge. Left eye exhibits no discharge. No scleral icterus.  Cardiovascular: Normal rate, regular rhythm and intact distal pulses.  Exam reveals no gallop and no friction rub.   Murmur heard.  Crescendo systolic murmur is present with a grade of 4/6  Pulmonary/Chest: Effort normal and breath sounds normal. No respiratory distress. She has no wheezes. She has no rales. She exhibits no tenderness.  Musculoskeletal:  L amputation Above the knee  Neurological: She is alert and oriented to person, place, and time.  Skin: Skin is warm, dry and intact. No rash noted. No erythema. No pallor.  Thick discolored nails and skin on foot on the R  Psychiatric: She has a normal mood and affect. Her speech is normal and behavior is normal. Judgment and thought content normal. Cognition and memory are normal.  Nursing note and vitals reviewed.   Results for orders placed or performed in visit on 05/12/15  Hemoglobin A1c  Result Value Ref Range   Hemoglobin A1C 6.1  Microalbumin, urine  Result Value Ref Range   Microalb, Ur 182.6       Assessment & Plan:   Problem List Items Addressed This Visit      Cardiovascular and Mediastinum   HTN (hypertension) - Primary    Checking CMP today. Slightly elevated. Cannot provide microalbumin. Continue current regimen.  Continue to monitor.       Relevant Orders   CBC with Differential/Platelet   Comprehensive metabolic panel   Lipid Panel w/o Chol/HDL Ratio   TSH   CAD (coronary artery disease)    Checking labs today. Continue to monitor. Continue to follow with cardiology. Call with any concerns.       Relevant Orders   CBC with Differential/Platelet   Comprehensive metabolic panel   Lipid Panel w/o Chol/HDL Ratio   TSH     Endocrine   Type 2 diabetes mellitus with diabetic polyneuropathy (HCC)    Checking labs today. Continue to monitor. Continue to follow with endocrinology. Call with any concerns. Advised her to follow the directions of her endocrinologist who advised decreasing her insulin. She has not done this.       Relevant Orders   CBC with Differential/Platelet   Comprehensive metabolic panel   Lipid Panel w/o Chol/HDL Ratio   TSH   Hypoglycemia    Checking labs today. Continue to monitor. Continue to follow with endocrinology. Call with any concerns. Advised her to follow the directions of her endocrinologist who advised decreasing her insulin. She has not done this.         Nervous and Auditory   Encephalopathy    Continue to follow with neurology and endocrine. Continue to monitor. Stable at this time.       Polyneuropathy (HCC)    Continue to follow with neurology and endocrine. Continue to monitor. Stable at this time.         Other   Seizures (HCC)    Continue to follow with neurology. Continue to monitor. Stable at this time.       Hyperlipidemia    Under good control. Labs checked today. Continue current regimen. Continue to monitor.       Relevant Orders   CBC with Differential/Platelet   Comprehensive metabolic panel   Lipid Panel w/o Chol/HDL Ratio   TSH   Long term current use of insulin (HCC)    Checking labs today. Continue to monitor. Continue to follow with endocrinology. Call with any concerns. Advised her to follow the directions of her  endocrinologist who advised decreasing her insulin. She has not done this.       Proliferative diabetic retinopathy (HCC)    Checking labs today. Continue to monitor. Continue to follow with endocrinology and opthalmology. Call with any concerns. Advised her to follow the directions of her endocrinologist who advised decreasing her insulin. She has not done this.       Relevant Orders   CBC with Differential/Platelet   Comprehensive metabolic panel   Lipid Panel w/o Chol/HDL Ratio   TSH   S/P AKA (above knee amputation) (HCC)    Continue to follow with vascular. Continue current regimen. Continue to monitor. Stable.       Relevant Orders   CBC with Differential/Platelet   Comprehensive metabolic panel   Lipid Panel w/o Chol/HDL Ratio   TSH   Chronic pain associated with significant psychosocial dysfunction    Continue to follow with [facility] clinic. Stable. Continue current regimen.       Amputated great  toe (HCC)   Relevant Orders   CBC with Differential/Platelet   Comprehensive metabolic panel   Lipid Panel w/o Chol/HDL Ratio   TSH    Other Visit Diagnoses    Immunization due        Relevant Orders    Flu Vaccine QUAD 36+ mos PF IM (Fluarix & Fluzone Quad PF) (Completed)        Follow up plan: Return in about 6 months (around 11/09/2015) for Follow up .

## 2015-05-12 NOTE — Assessment & Plan Note (Signed)
Under good control. Labs checked today. Continue current regimen. Continue to monitor.

## 2015-05-12 NOTE — Assessment & Plan Note (Signed)
Checking labs today. Continue to monitor. Continue to follow with endocrinology and opthalmology. Call with any concerns. Advised her to follow the directions of her endocrinologist who advised decreasing her insulin. She has not done this.

## 2015-05-12 NOTE — Assessment & Plan Note (Signed)
Continue to follow with neurology. Continue to monitor. Stable at this time.  

## 2015-05-12 NOTE — Assessment & Plan Note (Signed)
Checking labs today. Continue to monitor. Continue to follow with endocrinology. Call with any concerns. Advised her to follow the directions of her endocrinologist who advised decreasing her insulin. She has not done this.

## 2015-05-14 ENCOUNTER — Encounter: Payer: Self-pay | Admitting: Family Medicine

## 2015-05-14 ENCOUNTER — Telehealth: Payer: Self-pay | Admitting: Family Medicine

## 2015-05-14 LAB — COMPREHENSIVE METABOLIC PANEL
ALBUMIN: 3.5 g/dL (ref 3.5–4.8)
ALK PHOS: 70 IU/L (ref 39–117)
ALT: 12 IU/L (ref 0–32)
AST: 14 IU/L (ref 0–40)
Albumin/Globulin Ratio: 1.2 (ref 1.1–2.5)
BUN / CREAT RATIO: 36 — AB (ref 11–26)
BUN: 20 mg/dL (ref 8–27)
CHLORIDE: 102 mmol/L (ref 96–106)
CO2: 23 mmol/L (ref 18–29)
Calcium: 9 mg/dL (ref 8.7–10.3)
Creatinine, Ser: 0.56 mg/dL — ABNORMAL LOW (ref 0.57–1.00)
GFR calc non Af Amer: 94 mL/min/{1.73_m2} (ref >59)
GFR, EST AFRICAN AMERICAN: 108 mL/min/{1.73_m2} (ref >59)
GLOBULIN, TOTAL: 3 g/dL (ref 1.5–4.5)
Glucose: 112 mg/dL — ABNORMAL HIGH (ref 65–99)
Potassium: 3.8 mmol/L (ref 3.5–5.2)
Sodium: 139 mmol/L (ref 134–144)
TOTAL PROTEIN: 6.5 g/dL (ref 6.0–8.5)

## 2015-05-14 LAB — LIPID PANEL W/O CHOL/HDL RATIO
Cholesterol, Total: 128 mg/dL (ref 100–199)
HDL: 35 mg/dL — ABNORMAL LOW (ref >39)
LDL Calculated: 62 mg/dL (ref 0–99)
Triglycerides: 157 mg/dL — ABNORMAL HIGH (ref 0–149)
VLDL Cholesterol Cal: 31 mg/dL (ref 5–40)

## 2015-05-14 LAB — CBC WITH DIFFERENTIAL/PLATELET
BASOS ABS: 0 10*3/uL (ref 0.0–0.2)
BASOS: 0 %
EOS (ABSOLUTE): 0.5 10*3/uL — ABNORMAL HIGH (ref 0.0–0.4)
EOS: 6 %
HEMATOCRIT: 37.5 % (ref 34.0–46.6)
HEMOGLOBIN: 12.3 g/dL (ref 11.1–15.9)
Immature Grans (Abs): 0 10*3/uL (ref 0.0–0.1)
Immature Granulocytes: 0 %
Lymphocytes Absolute: 1.7 10*3/uL (ref 0.7–3.1)
Lymphs: 23 %
MCH: 26.3 pg — AB (ref 26.6–33.0)
MCHC: 32.8 g/dL (ref 31.5–35.7)
MCV: 80 fL (ref 79–97)
MONOCYTES: 5 %
Monocytes Absolute: 0.4 10*3/uL (ref 0.1–0.9)
NEUTROS ABS: 5 10*3/uL (ref 1.4–7.0)
Neutrophils: 66 %
Platelets: 198 10*3/uL (ref 150–379)
RBC: 4.67 x10E6/uL (ref 3.77–5.28)
RDW: 16.1 % — ABNORMAL HIGH (ref 12.3–15.4)
WBC: 7.6 10*3/uL (ref 3.4–10.8)

## 2015-05-14 LAB — TSH: TSH: 4.25 u[IU]/mL (ref 0.450–4.500)

## 2015-05-14 NOTE — Telephone Encounter (Signed)
Dr.Dew at [facility] Will fax results to both offices.

## 2015-05-14 NOTE — Telephone Encounter (Signed)
Can you please confirm her vascular doctor? Also, we are unable to send the results through the computer to [facility]- can you please send copy of most recent labwork to Dr. Monia Sabal at [facility] and her vascular doctor. Thanks!

## 2015-07-08 DIAGNOSIS — I70211 Atherosclerosis of native arteries of extremities with intermittent claudication, right leg: Secondary | ICD-10-CM | POA: Diagnosis not present

## 2015-07-08 DIAGNOSIS — I739 Peripheral vascular disease, unspecified: Secondary | ICD-10-CM | POA: Diagnosis not present

## 2015-07-31 DIAGNOSIS — E1165 Type 2 diabetes mellitus with hyperglycemia: Secondary | ICD-10-CM | POA: Diagnosis not present

## 2015-11-10 ENCOUNTER — Ambulatory Visit: Payer: Medicare Other | Admitting: Family Medicine

## 2015-11-27 DEATH — deceased
